# Patient Record
Sex: Female | Born: 1950 | Race: White | Hispanic: No | Marital: Married | Smoking: Never smoker
Health system: Southern US, Community
[De-identification: ages and names within clinical notes are randomized; demographics above are authoritative.]

## PROBLEM LIST (undated history)

## (undated) DIAGNOSIS — M8580 Other specified disorders of bone density and structure, unspecified site: Secondary | ICD-10-CM

## (undated) DIAGNOSIS — J449 Chronic obstructive pulmonary disease, unspecified: Secondary | ICD-10-CM

## (undated) DIAGNOSIS — E669 Obesity, unspecified: Secondary | ICD-10-CM

## (undated) DIAGNOSIS — R0600 Dyspnea, unspecified: Secondary | ICD-10-CM

## (undated) DIAGNOSIS — R0609 Other forms of dyspnea: Secondary | ICD-10-CM

## (undated) DIAGNOSIS — E785 Hyperlipidemia, unspecified: Secondary | ICD-10-CM

## (undated) DIAGNOSIS — R32 Unspecified urinary incontinence: Secondary | ICD-10-CM

## (undated) DIAGNOSIS — R7303 Prediabetes: Secondary | ICD-10-CM

## (undated) DIAGNOSIS — E039 Hypothyroidism, unspecified: Secondary | ICD-10-CM

## (undated) HISTORY — DX: Hypothyroidism, unspecified: E03.9

## (undated) HISTORY — DX: Other specified disorders of bone density and structure, unspecified site: M85.80

## (undated) HISTORY — DX: Chronic obstructive pulmonary disease, unspecified: J44.9

## (undated) HISTORY — PX: RHINOPLASTY: SUR1284

## (undated) HISTORY — DX: Prediabetes: R73.03

## (undated) HISTORY — DX: Hyperlipidemia, unspecified: E78.5

## (undated) HISTORY — DX: Other forms of dyspnea: R06.09

## (undated) HISTORY — DX: Unspecified urinary incontinence: R32

## (undated) HISTORY — DX: Dyspnea, unspecified: R06.00

## (undated) HISTORY — DX: Obesity, unspecified: E66.9

## (undated) HISTORY — PX: APPENDECTOMY: SHX54

---

## 1997-11-06 ENCOUNTER — Other Ambulatory Visit: Admission: RE | Admit: 1997-11-06 | Discharge: 1997-11-06 | Payer: Self-pay | Admitting: *Deleted

## 1998-11-29 ENCOUNTER — Other Ambulatory Visit: Admission: RE | Admit: 1998-11-29 | Discharge: 1998-11-29 | Payer: Self-pay | Admitting: *Deleted

## 1999-12-09 ENCOUNTER — Other Ambulatory Visit: Admission: RE | Admit: 1999-12-09 | Discharge: 1999-12-09 | Payer: Self-pay | Admitting: *Deleted

## 2001-07-02 ENCOUNTER — Other Ambulatory Visit: Admission: RE | Admit: 2001-07-02 | Discharge: 2001-07-02 | Payer: Self-pay | Admitting: *Deleted

## 2003-04-13 ENCOUNTER — Ambulatory Visit: Admission: RE | Admit: 2003-04-13 | Discharge: 2003-04-13 | Payer: Self-pay | Admitting: *Deleted

## 2003-04-13 ENCOUNTER — Encounter (INDEPENDENT_AMBULATORY_CARE_PROVIDER_SITE_OTHER): Payer: Self-pay | Admitting: *Deleted

## 2007-01-04 ENCOUNTER — Encounter: Admission: RE | Admit: 2007-01-04 | Discharge: 2007-01-04 | Payer: Self-pay | Admitting: Family Medicine

## 2007-09-24 ENCOUNTER — Other Ambulatory Visit: Admission: RE | Admit: 2007-09-24 | Discharge: 2007-09-24 | Payer: Self-pay | Admitting: Obstetrics and Gynecology

## 2010-06-03 ENCOUNTER — Other Ambulatory Visit (HOSPITAL_COMMUNITY)
Admission: RE | Admit: 2010-06-03 | Discharge: 2010-06-03 | Disposition: A | Discharge status: Home / Self Care | Payer: BC Managed Care – PPO | Source: Ambulatory Visit | Attending: Family Medicine | Admitting: Family Medicine

## 2010-06-03 DIAGNOSIS — Z124 Encounter for screening for malignant neoplasm of cervix: Secondary | ICD-10-CM | POA: Insufficient documentation

## 2010-12-16 ENCOUNTER — Ambulatory Visit (INDEPENDENT_AMBULATORY_CARE_PROVIDER_SITE_OTHER): Payer: BC Managed Care – PPO | Admitting: Internal Medicine

## 2010-12-16 DIAGNOSIS — R0989 Other specified symptoms and signs involving the circulatory and respiratory systems: Secondary | ICD-10-CM

## 2010-12-16 LAB — PULMONARY FUNCTION TEST

## 2010-12-16 NOTE — Progress Notes (Signed)
PFT done today. 

## 2010-12-27 ENCOUNTER — Encounter: Payer: Self-pay | Admitting: Internal Medicine

## 2011-01-16 ENCOUNTER — Encounter: Payer: Self-pay | Admitting: Internal Medicine

## 2011-01-16 ENCOUNTER — Institutional Professional Consult (permissible substitution): Payer: BC Managed Care – PPO | Admitting: Pulmonary Disease

## 2011-01-17 ENCOUNTER — Ambulatory Visit (INDEPENDENT_AMBULATORY_CARE_PROVIDER_SITE_OTHER): Payer: BC Managed Care – PPO | Admitting: Internal Medicine

## 2011-01-17 ENCOUNTER — Encounter: Payer: Self-pay | Admitting: Internal Medicine

## 2011-01-17 ENCOUNTER — Institutional Professional Consult (permissible substitution): Payer: BC Managed Care – PPO | Admitting: Internal Medicine

## 2011-01-17 DIAGNOSIS — J45909 Unspecified asthma, uncomplicated: Secondary | ICD-10-CM

## 2011-01-17 MED ORDER — MOMETASONE FURO-FORMOTEROL FUM 100-5 MCG/ACT IN AERO: INHALATION_SPRAY | RESPIRATORY_TRACT | Status: DC

## 2011-01-17 NOTE — Patient Instructions (Signed)
Trial of dulera 100  1 puff every 12 hours and if not effective 2 puffs every 12 hours to see if cough and breathing get better  Work on inhaler technique:  relax and gently blow all the way out then take a nice smooth deep breath back in, triggering the inhaler at same time you start breathing in.  Hold for up to 5 seconds if you can.  Rinse and gargle with water when done   If your mouth or throat starts to bother you,   I suggest you time the inhaler to your dental care and after using the inhaler(s) brush teeth and tongue with a baking soda containing toothpaste and when you rinse this out, gargle with it first to see if this helps your mouth and throat.     GERD (REFLUX)  is an extremely common cause of respiratory symptoms, many times with no significant heartburn at all.    It can be treated with medication, but also with lifestyle changes including avoidance of late meals, excessive alcohol, smoking cessation, and avoid fatty foods, chocolate, peppermint, colas, red wine, and acidic juices such as orange juice.  NO MINT OR MENTHOL PRODUCTS SO NO COUGH DROPS  USE SUGARLESS CANDY INSTEAD (jolley ranchers or Stover's)  NO OIL BASED VITAMINS - use powdered substitutes.   Try prilosec 20mg   Take 30-60 min before first meal of the day and Pepcid 20 mg one bedtime until cough is completely gone for at least a week without the need for cough suppression  I think of reflux for chronic cough like I do oxygen for fire (doesn't cause the fire but once you get the oxygen suppressed it usually goes away regardless of the exact cause).    Please schedule a follow up office visit in 2 weeks, sooner if needed

## 2011-01-17 NOTE — Progress Notes (Signed)
  Subjective:    Patient ID: Adriana Oliver, female    DOB: 07-08-1950, 60 y.o.   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  Can walk 1.5 miles s stopping. Cough presently mostly dry. Denies using saba but when given this for her pft made her real shaky.  Sleeping ok now  without nocturnal  or early am exacerbation  of respiratory  c/o's or need for noct saba. Also denies any obvious fluctuation of symptoms with weather or environmental changes or other aggravating or alleviating factors except as outlined above.    Review of Systems  Constitutional: Positive for appetite change. Negative for fever, chills and unexpected weight change.  HENT: Positive for ear pain and congestion. Negative for nosebleeds, sore throat, rhinorrhea, sneezing, trouble swallowing, dental problem, voice change, postnasal drip and sinus pressure.   Eyes: Negative for visual disturbance.  Respiratory: Positive for cough and shortness of breath. Negative for choking.   Cardiovascular: Negative for chest pain and leg swelling.  Gastrointestinal: Negative for vomiting, abdominal pain and diarrhea.  Genitourinary: Negative for difficulty urinating.  Musculoskeletal: Positive for arthralgias.  Skin: Negative for rash.  Neurological: Negative for tremors, syncope and headaches.  Hematological: Does not bruise/bleed easily.       Objective:   Physical Exam  Hoarse amb wf with prominent pseudowheeze   wt 223 01/13/11  HEENT: nl dentition, turbinates, and orophanx. Nl external ear canals without cough reflex   NECK :  without JVD/Nodes/TM/ nl carotid upstrokes bilaterally   LUNGS: no acc  muscle use, clear to A and P bilaterally without cough on insp or exp maneuvers   CV:  RRR  no s3 or murmur or increase in P2, no edema   ABD:  soft and nontender with nl excursion in the supine position. No bruits or organomegaly, bowel sounds nl  MS:  warm without deformities, calf tenderness, cyanosis or clubbing  SKIN: warm and dry without lesions    NEURO:  alert, approp, no deficits        Assessment & Plan:

## 2011-01-18 ENCOUNTER — Encounter: Payer: Self-pay | Admitting: Internal Medicine

## 2011-01-18 DIAGNOSIS — J45909 Unspecified asthma, uncomplicated: Secondary | ICD-10-CM | POA: Insufficient documentation

## 2011-01-18 NOTE — Assessment & Plan Note (Addendum)
Symptoms are markedly disproportionate to objective findings and not clear this is a lung problem but pt does appear to have difficult airway management issues.   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   These are the same pts who not infrequently have failed to tolerate ace inhibitors,  dry powder inhalers or biphosphonates or report having reflux symptoms that don't respond to standard doses of PPI , and are easily confused as having aecopd or asthma flares,   For now will max rx for GERD and start on very low doses of dulera  The proper method of use, as well as anticipated side effects, of this metered-dose inhaler are discussed and demonstrated to the patient. Improved to 75% with extensive coaching.

## 2011-02-01 ENCOUNTER — Ambulatory Visit (INDEPENDENT_AMBULATORY_CARE_PROVIDER_SITE_OTHER): Payer: BC Managed Care – PPO | Admitting: Internal Medicine

## 2011-02-01 ENCOUNTER — Encounter: Payer: Self-pay | Admitting: Internal Medicine

## 2011-02-01 DIAGNOSIS — J45909 Unspecified asthma, uncomplicated: Secondary | ICD-10-CM

## 2011-02-01 MED ORDER — MOMETASONE FURO-FORMOTEROL FUM 100-5 MCG/ACT IN AERO: INHALATION_SPRAY | RESPIRATORY_TRACT | Status: DC

## 2011-02-01 MED ORDER — OMEPRAZOLE 20 MG PO CPDR: DELAYED_RELEASE_CAPSULE | ORAL | Status: DC

## 2011-02-01 NOTE — Patient Instructions (Signed)
No change in treatment through the month of November then try off the second dose of prilosec  Please schedule a follow up office visit in 6 weeks, call sooner if needed

## 2011-02-01 NOTE — Progress Notes (Signed)
  Subjective:    Patient ID: Adriana Oliver, female    DOB: 04/24/1950, 60 y.o.   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  Can walk 1.5 miles s stopping. Cough presently mostly dry. Denies using saba but when given this for her pft made her real shaky. rec Trial of dulera 100  1 puff every 12 hours and if not effective 2 puffs every 12 hours to see if cough and breathing get better Work on inhaler technique GERD diet  Try prilosec 20mg   Take 30-60 min before first meal of the day and Pepcid 20 mg one bedtime    02/01/2011 f/u ov/Torsten Weniger cc some better the first day of rx, gradual complete recovery by 4 days prior to ov.    Sleeping ok now  without nocturnal  or early am exacerbation  of respiratory  c/o's or need for noct saba. Also denies any obvious fluctuation of symptoms with weather or environmental changes or other aggravating or alleviating factors except as outlined above.     ROS  At present neg for  any significant sore throat, dysphagia, itching, sneezing,  nasal congestion or excess/ purulent secretions,  fever, chills, sweats, unintended wt loss, pleuritic or exertional cp, hempoptysis, orthopnea pnd or leg swelling.  Also denies presyncope, palpitations, heartburn, abdominal pain, nausea, vomiting, diarrhea  or change in bowel or urinary habits, dysuria,hematuria,  rash, arthralgias, visual complaints, headache, numbness weakness or ataxia.          Objective:   Physical Exam  Hoarse amb wf with no longer any pseudowheeze   wt 223 01/13/11 > 02/01/2011  223  HEENT: nl dentition, turbinates, and orophanx. Nl external ear canals  without cough reflex   NECK :  without JVD/Nodes/TM/ nl carotid upstrokes bilaterally   LUNGS: no acc muscle use, clear to A and P bilaterally without cough on insp or exp maneuvers   CV:  RRR  no s3 or murmur or increase in P2, no edema   ABD:  soft and nontender with nl excursion in the supine position. No bruits or organomegaly, bowel sounds nl  MS:  warm without deformities, calf tenderness, cyanosis or clubbing           Assessment & Plan:

## 2011-02-02 NOTE — Assessment & Plan Note (Signed)
Not clear whether this was true asthma vs  Classic Upper airway cough syndrome, so named because it's frequently impossible to sort out how much is  CR/sinusitis with freq throat clearing (which can be related to primary GERD)   vs  causing  secondary (" extra esophageal")  GERD from wide swings in gastric pressure that occur with throat clearing, often  promoting self use of mint and menthol lozenges that reduce the lower esophageal sphincter tone and exacerbate the problem further in a cyclical fashion.   These are the same pts who not infrequently have failed to tolerate ace inhibitors,  dry powder inhalers or biphosphonates or report having reflux symptoms that don't respond to standard doses of PPI , and are easily confused as having aecopd or asthma flares,   For now treat both asthma and gerd and then try taper gerd rx p 6 weeks to see if any symptoms flare  See instructions for specific recommendations which were reviewed directly with the patient who was given a copy with highlighter outlining the key components.

## 2011-04-05 ENCOUNTER — Telehealth: Payer: Self-pay | Admitting: Internal Medicine

## 2011-04-05 NOTE — Telephone Encounter (Signed)
I spoke with pt and she c/o cough w/ clear phlem, chest congestion, nasal congestion, chest tightness, foul taste in mouth, PND, sinus pressure is better x couple days. Pt denies any fever, nausea, vomiting, chills, sweats, sore throat. Pt has been taking mucinex OTC. 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 Pt would like something called in but nothing that makes her drowsy. Since MW is not in will forward to Dr. Shelle Iron for recs. Please advise thanks  No Known Allergies

## 2011-04-05 NOTE — Telephone Encounter (Signed)
LM for pt to call back. I made them aware we are now closed and will re-open at 8 am. He voiced his understanding and will relay message

## 2011-04-05 NOTE — Telephone Encounter (Signed)
Wrong doc.  Antionette Fairy

## 2011-04-05 NOTE — Telephone Encounter (Signed)
Dr. Sherene Sires set up a plan for her to follow to help sort thru her diagnosis.  She was supposed to return the second week of DEC.  She really needs to be seen.  I would suggest tylenol cold and sinus for symptom relief until then.  See if we can get her worked in next 24hrs?

## 2011-04-07 NOTE — Telephone Encounter (Signed)
[email or web address].Akhila Mahnken, CMA  

## 2011-04-10 NOTE — Telephone Encounter (Signed)
[email or web address]  

## 2011-04-11 NOTE — Telephone Encounter (Signed)
LMOM and will close encounter per protocol

## 2011-05-16 ENCOUNTER — Telehealth: Payer: Self-pay | Admitting: Internal Medicine

## 2011-05-16 MED ORDER — PREDNISONE 10 MG PO TABS: ORAL_TABLET | ORAL | Status: DC

## 2011-05-16 MED ORDER — AZITHROMYCIN 250 MG PO TABS: ORAL_TABLET | ORAL | Status: AC

## 2011-05-16 NOTE — Telephone Encounter (Signed)
Pt called back. Wants this asap as she has to be back in class at 2:30 today.

## 2011-05-16 NOTE — Telephone Encounter (Signed)
I spoke with pt and she c/o wet cough (can;t bring anything up), PND, nasal congestion, runny nose, and sinus pressure x Saturday. Denies any fever, chills, sweats, nausea, vomiting, fever. Pt states she is taking mucinex cough suppressant. 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 Pt is requesting to have something called in. Please advise Dr. Sherene Sires, thanks  No Known Allergies

## 2011-05-16 NOTE — Telephone Encounter (Signed)
LMOM for pt TCB 

## 2011-05-16 NOTE — Telephone Encounter (Signed)
Notified pt Rx called in. 

## 2011-05-16 NOTE — Telephone Encounter (Signed)
Sorry, didn't see the message until p 1230 - rx zpak and Prednisone 10 mg take  4 each am x 2 days,   2 each am x 2 days,  1 each am x2days and stop then ov if not 100% better

## 2011-06-19 ENCOUNTER — Telehealth: Payer: Self-pay | Admitting: Internal Medicine

## 2011-06-19 NOTE — Telephone Encounter (Signed)
Needs ov with all meds in hand to regroup

## 2011-06-19 NOTE — Telephone Encounter (Signed)
This is actually a MW patient-will forward to him to advise.

## 2011-06-19 NOTE — Telephone Encounter (Signed)
Called patient at home (work office was closed), advised of MW's recs.  Pt okay with these recommendations and appt scheduled with MW 3.20.13 @ 0915.  Pt is aware to bring all meds with her: prescriptions, inhalers, OTC.

## 2011-06-19 NOTE — Telephone Encounter (Signed)
CY-please advise as patient says she can get RX through insurance for an allergy medication($10 copay) to help with her cough due to pollen right now instead of having to pay $35 OTC. Thanks.

## 2011-06-21 ENCOUNTER — Encounter: Payer: Self-pay | Admitting: Internal Medicine

## 2011-06-21 ENCOUNTER — Ambulatory Visit (INDEPENDENT_AMBULATORY_CARE_PROVIDER_SITE_OTHER): Payer: BC Managed Care – PPO | Admitting: Internal Medicine

## 2011-06-21 DIAGNOSIS — J45909 Unspecified asthma, uncomplicated: Secondary | ICD-10-CM

## 2011-06-21 MED ORDER — PREDNISONE (PAK) 10 MG PO TABS: ORAL_TABLET | ORAL | Status: AC

## 2011-06-21 NOTE — Progress Notes (Signed)
  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  Can walk 1.5 miles s stopping. Cough presently mostly dry. Denies using saba but when given this for her pft made her real shaky. rec Trial of dulera 100  1 puff every 12 hours and if not effective 2 puffs every 12 hours to see if cough and breathing get better Work on inhaler technique GERD diet  Try prilosec 20mg   Take 30-60 min before first meal of the day and Pepcid 20 mg one bedtime    02/01/2011 f/u ov/Olumide Dolinger cc some better the first day of rx, gradual complete recovery by 4 days prior to ov.  rec No change in treatment through the month of November then try off the second dose of prilosec Please schedule a follow up office visit in 6 weeks> did not happen   06/21/2011 f/u ov/Michaelpaul Apo doing great after last visit tapered prilosec completely off  And stopped dulera and did fine until March 2013 when exposed pollen at [facility] and abuptly started coughing severe mostly dry and restarted dulera 100 2bid and omeprazole 20 min ac qd  Assoc with watery rhinitis- sob mostly with coughing.     Sleeping ok now  without nocturnal  or early am exacerbation  of respiratory  c/o's or need for noct saba. Also denies any obvious fluctuation of symptoms with weather or environmental changes or other aggravating or alleviating factors except as outlined above.     ROS  At present neg for  any significant sore throat, dysphagia, itching, sneezing,  nasal congestion or excess/ purulent  secretions,  fever, chills, sweats, unintended wt loss, pleuritic or exertional cp, hempoptysis, orthopnea pnd or leg swelling.  Also denies presyncope, palpitations, heartburn, abdominal pain, nausea, vomiting, diarrhea  or change in bowel or urinary habits, dysuria,hematuria,  rash, arthralgias, visual complaints, headache, numbness weakness or ataxia.          Objective:   Physical Exam  amb slt hoarse wf nad   wt 223 01/13/11 > 02/01/2011  223 > 06/21/2011  216  HEENT: nl dentition, turbinates, and orophanx. Nl external ear canals without cough reflex   NECK :  without JVD/Nodes/TM/ nl carotid upstrokes bilaterally   LUNGS: no acc muscle use, clear to A and P bilaterally without cough on insp or exp maneuvers   CV:  RRR  no s3 or murmur or increase in P2, no edema   ABD:  soft and nontender with nl excursion in the supine position. No bruits or organomegaly, bowel sounds nl  MS:  warm without deformities, calf tenderness, cyanosis or clubbing           Assessment & Plan:

## 2011-06-21 NOTE — Patient Instructions (Signed)
Prednisone 10 mg take  4 each am x 2 days,   2 each am x 2 days,  1 each am x2days and stop   Trial of dulera 100  2 puff every 12 hours and if not effective 2 puffs every 12 hours and out through nose  Work on perfecting  inhaler technique:  relax and gently blow all the way out then take a nice smooth deep breath back in, triggering the inhaler at same time you start breathing in.  Hold for up to 5 seconds if you can.  Rinse and gargle with water when done   If your mouth or throat starts to bother you,   I suggest you time the inhaler to your dental care and after using the inhaler(s) brush teeth and tongue with a baking soda containing toothpaste and when you rinse this out, gargle with it first to see if this helps your mouth and throat.     GERD (REFLUX)  is an extremely common cause of respiratory symptoms, many times with no significant heartburn at all.    It can be treated with medication, but also with lifestyle changes including avoidance of late meals, excessive alcohol, smoking cessation, and avoid fatty foods, chocolate, peppermint, colas, red wine, and acidic juices such as orange juice.  NO MINT OR MENTHOL PRODUCTS SO NO COUGH DROPS  USE SUGARLESS CANDY INSTEAD (jolley ranchers or Stover's)  NO OIL BASED VITAMINS - use powdered substitutes.   Try prilosec 20mg  Take 30- 60 min before your first and last meals of the day  and Pepcid 20 mg one bedtime until cough is completely gone for at least a week without the need for cough suppression then reduce one prilosec 20 mg Take 30-60 min before first meal of the day    Please schedule a follow up office visit in 4 weeks, sooner if needed with PFT's

## 2011-06-23 NOTE — Assessment & Plan Note (Addendum)
-   PFT's 12/16/10 FEV1  2.15 (69%) ratio 57 but 17% better p B2, DLCO 89%   - hfa 75% 01/16/11> 90% 06/21/2011   Based on previous pft's showing mod persistent asthma should not have stopped maintenance rx even though felt better.  The proper method of use, as well as anticipated side effects, of a metered-dose inhaler are discussed and demonstrated to the patient. Improved effectiveness after extensive coaching during this visit to a level of approximately  90%  ? Acid reflux ? Allergy playing a role in her present flare> rec treat both and return for pft's when at baseline.  See instructions for specific recommendations which were reviewed directly with the patient who was given a copy with highlighter outlining the key components.

## 2011-07-03 ENCOUNTER — Encounter: Payer: Self-pay | Admitting: Internal Medicine

## 2011-07-10 ENCOUNTER — Telehealth: Payer: Self-pay | Admitting: Internal Medicine

## 2011-07-10 NOTE — Telephone Encounter (Signed)
Per MW- Alpha 1 was neg.  LMTCB so I can inform the pt.

## 2011-07-12 NOTE — Telephone Encounter (Signed)
LMTCB

## 2011-07-12 NOTE — Telephone Encounter (Signed)
Pt returned Leslie's call.  Advised of A1AT results as stated by MW.  Pt verbalized her understanding and denied any questions.  Nothing further needed.

## 2011-07-14 ENCOUNTER — Telehealth: Payer: Self-pay | Admitting: Internal Medicine

## 2011-07-14 NOTE — Telephone Encounter (Signed)
Member ID # [id number]. Omeprazole 20 mg for twice daily dosing has been APPROVED from 07/14/11 through 07/13/12. Case # [id number]. Pharmacy notified and they will call the patient to let her know her prescription is ready for pick up.

## 2011-07-18 ENCOUNTER — Encounter: Payer: Self-pay | Admitting: Internal Medicine

## 2011-07-26 ENCOUNTER — Ambulatory Visit: Payer: BC Managed Care – PPO | Admitting: Internal Medicine

## 2011-08-11 ENCOUNTER — Ambulatory Visit (INDEPENDENT_AMBULATORY_CARE_PROVIDER_SITE_OTHER): Payer: BC Managed Care – PPO | Admitting: Internal Medicine

## 2011-08-11 ENCOUNTER — Encounter: Payer: Self-pay | Admitting: Internal Medicine

## 2011-08-11 DIAGNOSIS — J45909 Unspecified asthma, uncomplicated: Secondary | ICD-10-CM

## 2011-08-11 LAB — PULMONARY FUNCTION TEST

## 2011-08-11 MED ORDER — LORATADINE 10 MG PO CAPS: 1.0000 | ORAL_CAPSULE | Freq: Every day | ORAL | Status: DC

## 2011-08-11 NOTE — Progress Notes (Signed)
PFT done today. 

## 2011-08-11 NOTE — Patient Instructions (Addendum)
Dulera 100 Take 2 puffs first thing in am and then another 2 puffs about 12 hours later but ok to adjust down if doing great.  Clariton (loratidine 10 mg) one daily as needed itching sneezing runny nose    If you are satisfied with your treatment plan let your doctor know and he/she can either refill your medications or you can return here when your prescription runs out.     If in any way you are not 100% satisfied,  please tell us.  If 100% better, tell your friends!

## 2011-08-11 NOTE — Progress Notes (Signed)
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  Can walk 1.5 miles s stopping. Cough presently mostly dry. Denies using saba but when given this for her pft made her real shaky. rec Trial of dulera 100  1 puff every 12 hours and if not effective 2 puffs every 12 hours to see if cough and breathing get better Work on inhaler technique GERD diet  Try prilosec 20mg   Take 30-60 min before first meal of the day and Pepcid 20 mg one bedtime    02/01/2011 f/u ov/Adriana Oliver cc some better the first day of rx, gradual complete recovery by 4 days prior to ov.  rec No change in treatment through the month of November then try off the second dose of prilosec Please schedule a follow up office visit in 6 weeks> did not happen   06/21/2011 f/u ov/Adriana Oliver doing great after last visit tapered prilosec completely off  And stopped dulera and did fine until March 2013 when exposed pollen at [facility] and abuptly started coughing severe mostly dry and restarted dulera 100 2bid and omeprazole 20 min ac qd  Assoc with watery rhinitis- sob mostly with coughing. rec Prednisone 10 mg take  4 each am x 2 days,   2 each am x 2 days,  1 each am x2days and stop  Trial of dulera 100  2 puff every 12 hours and if not effective 2 puffs every 12 hours and out through nose Work on perfecting  inhaler technique     GERD diet Try prilosec 20mg  Take 30- 60 min before your first and last meals of the day  and Pepcid 20 mg one bedtime until cough is completely gone for  at least a week without the need for cough suppression then reduce one prilosec 20 mg Take 30-60 min before first meal of the day    08/11/2011 f/u ov/Adriana Oliver rarely taking dulera 100 (maybe a couple times a week) and maint rx with clariton daily "don't want to get hooked on inhalers).  No cough or limiting sob or need for daytime saba.     Sleeping ok now  without nocturnal  or early am exacerbation  of respiratory  c/o's or need for noct saba. Also denies any obvious fluctuation of symptoms with weather or environmental changes or other aggravating or alleviating factors except as outlined above.     ROS  At present neg for  any significant sore throat, dysphagia, itching, sneezing,  nasal congestion or excess/ purulent secretions,  fever, chills, sweats, unintended wt loss, pleuritic or exertional cp, hempoptysis, orthopnea pnd or leg swelling.  Also denies presyncope, palpitations, heartburn, abdominal pain, nausea, vomiting, diarrhea  or change in bowel or urinary habits, dysuria,hematuria,  rash, arthralgias, visual complaints, headache, numbness weakness or ataxia.          Objective:   Physical Exam  amb slt hoarse wf nad   wt 223 01/13/11 > 02/01/2011  223 > 06/21/2011  216 > 08/11/2011  220  HEENT: nl dentition, turbinates, and orophanx. Nl external ear canals without cough reflex   NECK :  without JVD/Nodes/TM/ nl carotid upstrokes bilaterally   LUNGS: no acc muscle use, clear to A and P bilaterally without cough on insp or exp maneuvers   CV:  RRR  no s3 or murmur or increase in P2, no edema   ABD:  soft and nontender with nl excursion in the supine position. No bruits or organomegaly, bowel sounds nl  MS:  warm without deformities, calf tenderness, cyanosis or clubbing           Assessment & Plan:

## 2011-08-12 NOTE — Assessment & Plan Note (Addendum)
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  Clearly minimal airflow obst at present despite very poor compliance with dulera - not clear how much of her symptoms were related to  Classic Upper airway cough syndrome, so named because it's frequently impossible to sort out how much is  CR/sinusitis with freq throat clearing (which can be related to primary GERD)   vs  causing  secondary (" extra esophageal")  GERD from wide swings in gastric pressure that occur with throat clearing, often  promoting self use of mint and menthol lozenges that reduce the lower esophageal sphincter tone and exacerbate the problem further in a cyclical fashion.   These are the same pts (now being labeled as having "irritable larynx syndrome" by some cough centers) who not infrequently have a history of having failed to tolerate ace inhibitors,  dry powder inhalers or biphosphonates or report having atypical reflux symptoms that don't respond to standard doses of PPI , and are easily confused as having aecopd or asthma flares by even experienced allergists/ pulmonologists.    I discussed risk of recurrent asthma flares and remodeling issues but don't think this will be much of a problem if she'll remember to use dulera consistently and not worry about getting "hooked on it" - if she does slack off, the nice thing about dulera is it starts working again w/in 15 min and she can't get the laba s the ics. (symbicort works the same but this is not a good pt for ics or advair, which work too slow)   Each maintenance medication was reviewed in detail including most importantly the difference between maintenance and as needed and under what circumstances the prns are to be used.  Please see instructions for details which were reviewed in writing and the patient given a copy.     The proper method of use, as well as anticipated side effects, of a metered-dose inhaler are discussed and demonstrated to the patient. Improved effectiveness after extensive coaching during this visit to a level of approximately  90%

## 2011-12-05 ENCOUNTER — Telehealth: Payer: Self-pay | Admitting: Internal Medicine

## 2011-12-05 NOTE — Telephone Encounter (Signed)
Lmomtcb x 1 

## 2011-12-06 NOTE — Telephone Encounter (Signed)
Spoke with pt who c/o congestion, nonprod cough, increased SOB at rest and with exertion, and chest tightness x 1 week.  Denies wheezing, f/c/s.  Taking mucinex bid and dulera bid.  No rescue inhaler on pt's med list.  She was last seen by Dr. Sherene Sires on 08/11/11.  I offered OV -- she declined.  States this is from school starting back and being around the kids.  Requesting recs.  Dr. Sherene Sires, pls advise.  Thank you.    No Known Allergies

## 2011-12-06 NOTE — Telephone Encounter (Signed)
Prednisone 10 mg take  4 each am x 2 days,   2 each am x 2 days,  1 each am x2days and stop   Needs ov with all inhalers in hand asap

## 2011-12-06 NOTE — Telephone Encounter (Signed)
LMTCBx1 at home number. Rx has not been sent until ov set.Carron Curie, CMA

## 2011-12-06 NOTE — Telephone Encounter (Signed)
lmomtcb x 2 for pt on work and mobile #

## 2011-12-07 NOTE — Telephone Encounter (Signed)
LMOVM TCB x1 to inform patient will send prednisone but she will need to follow up w/in 30days as directed by MW below.

## 2011-12-07 NOTE — Telephone Encounter (Signed)
I spoke with the pt and she again refuses appt, stating its just not possible to schedule an appt at this time. Dr. Sherene Sires do you still want me to send in the prednisone? Adriana Oliver, CMA

## 2011-12-07 NOTE — Telephone Encounter (Signed)
Ok to call in the prednisone but needs to get back for f/u within 30 days or we'll need to ask her to see somebody else as this is not the way we treat asthma in this clinic - ok to see Tammy NP also

## 2011-12-08 NOTE — Telephone Encounter (Signed)
lmomtcb x1 on home # and mobile # 

## 2011-12-11 NOTE — Telephone Encounter (Signed)
Spoke with pt and advised will call in pred taper, but must schedule appt. She still refused an appt and so I advised that we can not call in anything at this time. Pt states "that's fine have a great day" and hung up the phone.

## 2012-02-23 ENCOUNTER — Other Ambulatory Visit: Payer: Self-pay | Admitting: Family Medicine

## 2012-02-23 ENCOUNTER — Ambulatory Visit
Admission: RE | Admit: 2012-02-23 | Discharge: 2012-02-23 | Disposition: A | Discharge status: Home / Self Care | Payer: BC Managed Care – PPO | Source: Ambulatory Visit | Attending: Family Medicine | Admitting: Family Medicine

## 2012-02-23 DIAGNOSIS — M5410 Radiculopathy, site unspecified: Secondary | ICD-10-CM

## 2012-04-19 ENCOUNTER — Encounter: Payer: Self-pay | Admitting: Adult Health

## 2012-04-19 ENCOUNTER — Ambulatory Visit (INDEPENDENT_AMBULATORY_CARE_PROVIDER_SITE_OTHER): Payer: BC Managed Care – PPO | Admitting: Adult Health

## 2012-04-19 VITALS — BP 120/82 | HR 84 | Temp 98.7°F | Ht 62.0 in | Wt 210.8 lb

## 2012-04-19 DIAGNOSIS — J45909 Unspecified asthma, uncomplicated: Secondary | ICD-10-CM

## 2012-04-19 MED ORDER — AZITHROMYCIN 250 MG PO TABS: ORAL_TABLET | ORAL | Status: AC

## 2012-04-19 MED ORDER — HYDROCODONE-HOMATROPINE 5-1.5 MG/5ML PO SYRP: 5.0000 mL | ORAL_SOLUTION | Freq: Four times a day (QID) | ORAL | Status: AC | PRN

## 2012-04-19 MED ORDER — PREDNISONE 10 MG PO TABS: ORAL_TABLET | ORAL | Status: DC

## 2012-04-19 NOTE — Patient Instructions (Addendum)
Z-Pak take as directed. Prednisone taper. Over the next week Mucinex DM twice daily as needed. For cough and congestion. Hydromet 1-2 teaspoons every 4-6 hours as needed. For cough, may make you sleepy Fluids and rest. Tylenol as needed. Take Dulera 2 puffs twice daily on a regular basis. Follow up in 3 months with Dr. Sherene Sires and as needed Please contact office for sooner follow up if symptoms do not improve or worsen or seek emergency care

## 2012-04-19 NOTE — Progress Notes (Signed)
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  Can walk 1.5 miles s stopping. Cough presently mostly dry. Denies using saba but when given this for her pft made her real shaky. rec Trial of dulera 100  1 puff every 12 hours and if not effective 2 puffs every 12 hours to see if cough and breathing get better Work on inhaler technique GERD diet  Try prilosec 20mg   Take 30-60 min before first meal of the day and Pepcid 20 mg one bedtime    02/01/2011 f/u ov/Wert cc some better the first day of rx, gradual complete recovery by 4 days prior to ov.  rec No change in treatment through the month of November then try off the second dose of prilosec Please schedule a follow up office visit in 6 weeks> did not happen   06/21/2011 f/u ov/Wert doing great after last visit tapered prilosec completely off  And stopped dulera and did fine until March 2013 when exposed pollen at [facility] and abuptly started coughing severe mostly dry and restarted dulera 100 2bid and omeprazole 20 min ac qd  Assoc with watery rhinitis- sob mostly with coughing. rec Prednisone 10 mg take  4 each am x 2 days,   2 each am x 2 days,  1 each am x2days and stop  Trial of dulera 100  2 puff every 12 hours and if not effective 2 puffs every 12 hours and out through nose Work on perfecting  inhaler technique     GERD diet Try prilosec 20mg  Take 30- 60 min before your first and last meals of the day  and Pepcid 20 mg one bedtime until cough is completely gone for  at least a week without the need for cough suppression then reduce one prilosec 20 mg Take 30-60 min before first meal of the day    08/11/2011 f/u ov/Wert rarely taking dulera 100 (maybe a couple times a week) and maint rx with clariton daily "don't want to get hooked on inhalers).  No cough or limiting sob or need for daytime saba.     04/19/12 Acute OV  Complains of pt states productive cough,soc,wheezing,fever/chills,chest congestion.over a week . Has not been taking Dulera on reg basis .  Has been exposed to several students with URI symptoms.  OTC not helping.  No body aches .      ROS   Constitutional:   No  weight loss, night sweats,  + Fevers, chills, fatigue, or  lassitude.  HEENT:   No headaches,  Difficulty swallowing,  Tooth/dental problems, or  Sore throat,                No sneezing, itching, ear ache, nasal congestion, post nasal drip,   CV:  No chest pain,  Orthopnea, PND, swelling in lower extremities, anasarca, dizziness, palpitations, syncope.   GI  No heartburn, indigestion, abdominal pain, nausea, vomiting, diarrhea, change in bowel habits, loss of appetite, bloody stools.   Resp: ,  No  coughing up of blood.   Marland Kitchen  No chest wall deformity  Skin: no rash or lesions.  GU: no dysuria, change in color of urine, no urgency or frequency.  No flank pain, no hematuria   MS:  No joint pain or swelling.  No decreased range of motion.  No back pain.  Psych:  No change in mood or affect. No depression or anxiety.  No memory loss.              Objective:   Physical Exam  amb slt hoarse wf nad   wt 223 01/13/11 > 02/01/2011  223 > 06/21/2011  216 > 08/11/2011  220> 210 1/17   HEENT: nl dentition, turbinates, and orophanx. Nl external ear canals without cough reflex   NECK :  without JVD/Nodes/TM/ nl carotid upstrokes bilaterally   LUNGS: few exp wheezes    CV:  RRR  no s3 or murmur or increase in P2, no edema   ABD:  soft and nontender with nl  excursion in the supine position. No bruits or organomegaly, bowel sounds nl  MS:  warm without deformities, calf tenderness, cyanosis or clubbing           Assessment & Plan:

## 2012-04-21 NOTE — Assessment & Plan Note (Signed)
Flare with URI   Plan Z-Pak take as directed. Prednisone taper. Over the next week Mucinex DM twice daily as needed. For cough and congestion. Hydromet 1-2 teaspoons every 4-6 hours as needed. For cough, may make you sleepy Fluids and rest. Tylenol as needed. Take Dulera 2 puffs twice daily on a regular basis. Follow up in 3 months with Dr. Sherene Sires and as needed Please contact office for sooner follow up if symptoms do not improve or worsen or seek emergency care

## 2012-04-29 ENCOUNTER — Other Ambulatory Visit: Payer: Self-pay | Admitting: Internal Medicine

## 2012-05-20 ENCOUNTER — Telehealth: Payer: Self-pay | Admitting: Internal Medicine

## 2012-05-20 NOTE — Telephone Encounter (Signed)
LMTCB

## 2012-05-21 NOTE — Telephone Encounter (Signed)
lmomtcb at both #s provided above.

## 2012-05-22 NOTE — Telephone Encounter (Signed)
lmtcb x3 advising we have been trying to get in touch with her x 3 days now w/o a call back. If anything further was needed to give [place] a call back. Will sign off per triage protocol.

## 2012-09-08 ENCOUNTER — Other Ambulatory Visit: Payer: Self-pay | Admitting: Internal Medicine

## 2013-11-18 ENCOUNTER — Other Ambulatory Visit (HOSPITAL_COMMUNITY)
Admission: RE | Admit: 2013-11-18 | Discharge: 2013-11-18 | Disposition: A | Discharge status: Home / Self Care | Payer: BC Managed Care – PPO | Source: Ambulatory Visit | Attending: Family Medicine | Admitting: Family Medicine

## 2013-11-18 ENCOUNTER — Other Ambulatory Visit: Payer: Self-pay | Admitting: Family Medicine

## 2013-11-18 DIAGNOSIS — Z1151 Encounter for screening for human papillomavirus (HPV): Secondary | ICD-10-CM | POA: Diagnosis present

## 2013-11-18 DIAGNOSIS — Z124 Encounter for screening for malignant neoplasm of cervix: Secondary | ICD-10-CM | POA: Insufficient documentation

## 2013-11-20 LAB — CYTOLOGY - PAP

## 2014-05-14 ENCOUNTER — Emergency Department (HOSPITAL_COMMUNITY): Payer: BC Managed Care – PPO

## 2014-05-14 ENCOUNTER — Encounter (HOSPITAL_COMMUNITY): Payer: Self-pay | Admitting: Adult Health

## 2014-05-14 ENCOUNTER — Emergency Department (HOSPITAL_COMMUNITY)
Admission: EM | Admit: 2014-05-14 | Discharge: 2014-05-15 | Disposition: A | Discharge status: Home / Self Care | Payer: BC Managed Care – PPO | Attending: Emergency Medicine | Admitting: Emergency Medicine

## 2014-05-14 DIAGNOSIS — Y998 Other external cause status: Secondary | ICD-10-CM | POA: Diagnosis not present

## 2014-05-14 DIAGNOSIS — Z7952 Long term (current) use of systemic steroids: Secondary | ICD-10-CM | POA: Insufficient documentation

## 2014-05-14 DIAGNOSIS — S43014A Anterior dislocation of right humerus, initial encounter: Secondary | ICD-10-CM | POA: Diagnosis not present

## 2014-05-14 DIAGNOSIS — E785 Hyperlipidemia, unspecified: Secondary | ICD-10-CM | POA: Insufficient documentation

## 2014-05-14 DIAGNOSIS — Y92009 Unspecified place in unspecified non-institutional (private) residence as the place of occurrence of the external cause: Secondary | ICD-10-CM

## 2014-05-14 DIAGNOSIS — E669 Obesity, unspecified: Secondary | ICD-10-CM | POA: Insufficient documentation

## 2014-05-14 DIAGNOSIS — Y9389 Activity, other specified: Secondary | ICD-10-CM | POA: Diagnosis not present

## 2014-05-14 DIAGNOSIS — S4991XA Unspecified injury of right shoulder and upper arm, initial encounter: Secondary | ICD-10-CM | POA: Diagnosis present

## 2014-05-14 DIAGNOSIS — Z791 Long term (current) use of non-steroidal anti-inflammatories (NSAID): Secondary | ICD-10-CM | POA: Insufficient documentation

## 2014-05-14 DIAGNOSIS — E039 Hypothyroidism, unspecified: Secondary | ICD-10-CM | POA: Insufficient documentation

## 2014-05-14 DIAGNOSIS — Y9289 Other specified places as the place of occurrence of the external cause: Secondary | ICD-10-CM | POA: Insufficient documentation

## 2014-05-14 DIAGNOSIS — R202 Paresthesia of skin: Secondary | ICD-10-CM | POA: Diagnosis not present

## 2014-05-14 DIAGNOSIS — J449 Chronic obstructive pulmonary disease, unspecified: Secondary | ICD-10-CM | POA: Insufficient documentation

## 2014-05-14 DIAGNOSIS — M79601 Pain in right arm: Secondary | ICD-10-CM

## 2014-05-14 DIAGNOSIS — W010XXA Fall on same level from slipping, tripping and stumbling without subsequent striking against object, initial encounter: Secondary | ICD-10-CM | POA: Diagnosis not present

## 2014-05-14 DIAGNOSIS — Z79899 Other long term (current) drug therapy: Secondary | ICD-10-CM | POA: Insufficient documentation

## 2014-05-14 DIAGNOSIS — W19XXXA Unspecified fall, initial encounter: Secondary | ICD-10-CM

## 2014-05-14 DIAGNOSIS — S43004A Unspecified dislocation of right shoulder joint, initial encounter: Secondary | ICD-10-CM

## 2014-05-14 MED ORDER — ONDANSETRON HCL 4 MG/2ML IJ SOLN
4.0000 mg | Freq: Once | INTRAMUSCULAR | Status: AC
  Administered 2014-05-14: 4 mg via INTRAVENOUS
  Filled 2014-05-14: qty 2

## 2014-05-14 MED ORDER — HYDROCODONE-ACETAMINOPHEN 5-325 MG PO TABS: 1.0000 | ORAL_TABLET | Freq: Four times a day (QID) | ORAL | Status: DC | PRN

## 2014-05-14 MED ORDER — PROPOFOL 10 MG/ML IV BOLUS
INTRAVENOUS | Status: AC | PRN
  Administered 2014-05-14 (×2): 50 mg via INTRAVENOUS

## 2014-05-14 MED ORDER — HYDROMORPHONE HCL 1 MG/ML IJ SOLN
1.0000 mg | Freq: Once | INTRAMUSCULAR | Status: AC
  Administered 2014-05-14: 1 mg via INTRAVENOUS
  Filled 2014-05-14: qty 1

## 2014-05-14 MED ORDER — MORPHINE SULFATE 4 MG/ML IJ SOLN
8.0000 mg | Freq: Once | INTRAMUSCULAR | Status: AC
  Administered 2014-05-14: 8 mg via INTRAVENOUS
  Filled 2014-05-14: qty 2

## 2014-05-14 MED ORDER — NAPROXEN 500 MG PO TABS: 500.0000 mg | ORAL_TABLET | Freq: Two times a day (BID) | ORAL | Status: DC | PRN

## 2014-05-14 MED ORDER — PROPOFOL 10 MG/ML IV BOLUS
50.0000 mg | INTRAVENOUS | Status: DC | PRN
  Filled 2014-05-14: qty 20

## 2014-05-14 NOTE — Progress Notes (Signed)
Orthopedic Tech Progress Note Patient Details:  Adriana Oliver 08-10-1950 [id number]  Ortho Devices Type of Ortho Device: Sling immobilizer Ortho Device/Splint Location: RUE Ortho Device/Splint Interventions: Ordered, Application   Jennye MoccasinHughes, [name] 05/14/2014, 10:36 PM

## 2014-05-14 NOTE — ED Notes (Signed)
Presents post fall, while holding grandson, she leaned over a baby gate to put child down and lost balance, falling and landing on right shoulder. Right shoulder lower than left-c/o severe pain-CMS intact. Denies other injuries.

## 2014-05-14 NOTE — ED Provider Notes (Signed)
CSN: [id number]     Arrival date & time 05/14/14  1931 History   First MD Initiated Contact with Patient 05/14/14 2008     Chief Complaint  Patient presents with  . Shoulder Injury     (Consider location/radiation/quality/duration/timing/severity/associated sxs/prior Treatment) HPI Comments: Adriana Oliver is a 64 y.o. female with a PMHx of COPD, HLD, obesity, and hypothyroidism, who presents to the ED with complaints of mechanical fall approximately 1 hour prior to arrival around 7:15 PM. She states she was holding her grandson, tripped over the baby gate when she was trying to put him down, landing on her right shoulder. She states that she currently has 10/10 right shoulder pain that radiates down towards her elbow, which is throbbing and constant, worse with movement and with no medications tried prior to arrival. She states she has never injured this shoulder before. She reports tingling of the outside of her right shoulder, but denies any numbness or weakness in her forearm or hands. She denies any other injuries sustained during the fall, denying any head injury or loss of consciousness, neck pain or back pain, elbow pain, wrist pain, chest pain or shortness of breath, headache, or vision changes. She denies having any symptoms prior to falling that led to her fall, adamantly reports that she simply tripped over the baby gate.   No hx of HTN, no meds taken for BP. PCP is Dr. Cam Hai.  Patient is a 64 y.o. female presenting with shoulder injury. The history is provided by the patient. No language interpreter was used.  Shoulder Injury This is a new problem. The current episode started today. The problem occurs rarely. The problem has been unchanged. Associated symptoms include arthralgias (R shoulder). Pertinent negatives include no abdominal pain, chest pain, headaches, joint swelling, nausea, neck pain, numbness, vomiting or weakness. Exacerbated by: movement of R shoulder. She has  tried nothing for the symptoms. The treatment provided no relief.    Past Medical History  Diagnosis Date  . Incontinence   . Hypothyroidism   . Obesity   . Hyperlipidemia   . COPD (chronic obstructive pulmonary disease)    Past Surgical History  Procedure Laterality Date  . Rhinoplasty    . Appendectomy     Family History  Problem Relation Age of Onset  . Asthma Mother     deceased  . Diabetes Sister   . Arthritis Sister   . Emphysema Mother    History  Substance Use Topics  . Smoking status: Never Smoker   . Smokeless tobacco: Never Used  . Alcohol Use: No   OB History    No data available     Review of Systems  HENT: Negative for facial swelling.   Eyes: Negative for visual disturbance.  Respiratory: Negative for shortness of breath.   Cardiovascular: Negative for chest pain.  Gastrointestinal: Negative for nausea, vomiting and abdominal pain.  Musculoskeletal: Positive for arthralgias (R shoulder). Negative for back pain, joint swelling and neck pain.  Skin: Negative for color change and wound.  Neurological: Negative for dizziness, weakness, light-headedness, numbness and headaches.       +tingling in RUE  Hematological: Does not bruise/bleed easily.  Psychiatric/Behavioral: Negative for confusion.   10 Systems reviewed and are negative for acute change except as noted in the HPI.    Allergies  Review of patient's allergies indicates no known allergies.  Home Medications   Prior to Admission medications   Medication Sig Start Date End Date Taking?  Authorizing Provider  acetaminophen (TYLENOL) 650 MG CR tablet Take 650 mg by mouth every 8 (eight) hours as needed.    Historical Provider, MD  Alpha-D-Galactosidase Charlyne Quale) TABS As directed when needed    Historical Provider, MD  benzonatate (TESSALON) 100 MG capsule Take 100 mg by mouth 3 (three) times daily as needed.  04/15/12   Historical Provider, MD  Docosanol (ABREVA EX) Apply topically. As directed  as needed    Historical Provider, MD  DULERA 100-5 MCG/ACT AERO inhale 1 to 2 puffs by mouth twice a day 04/29/12   Nyoka Cowden, MD  fexofenadine (ALLEGRA) 180 MG tablet Take 180 mg by mouth daily.    Historical Provider, MD  HYDROcodone-homatropine (HYCODAN) 5-1.5 MG/5ML syrup 5 mLs every 4 (four) hours as needed.  04/15/12   Historical Provider, MD  levothyroxine (SYNTHROID, LEVOTHROID) 88 MCG tablet Take 1 tablet by mouth daily. 12/16/10   Historical Provider, MD  loratadine (CLARITIN) 10 MG tablet take 1 tablet by mouth once daily 09/08/12   Nyoka Cowden, MD  Naproxen Sodium (ALEVE) 220 MG CAPS Per bottle directions as needed    Historical Provider, MD  omeprazole (PRILOSEC) 20 MG capsule before first and last meals daily 02/01/11   Nyoka Cowden, MD  Phenylephrine-DM-GG-APAP (RA MUCUS RELIEF PLUS) 5-10-200-325 MG TABS Take 1 tablet by mouth 2 (two) times daily.    Historical Provider, MD  pravastatin (PRAVACHOL) 20 MG tablet Take 1 tablet by mouth daily. 12/16/10   Historical Provider, MD  predniSONE (DELTASONE) 10 MG tablet 4 tabs for 2 days, then 3 tabs for 2 days, 2 tabs for 2 days, then 1 tab for 2 days, then stop 04/19/12   Tammy S Parrett, NP   BP 156/105 mmHg  Pulse 93  Temp(Src) 97.9 F (36.6 C) (Oral)  Resp 20  Ht  (1.575 m)  Wt 203 lb (92.08 kg)  BMI 37.12 kg/m2  SpO2 92% Physical Exam  Constitutional: She is oriented to person, place, and time. She appears well-developed and well-nourished.  Non-toxic appearance. She appears distressed.  Appears very uncomfortable in pain, hypertensive at 156/105 likely pain related.   HENT:  Head: Normocephalic and atraumatic.  Mouth/Throat: Mucous membranes are normal.  Eyes: Conjunctivae and EOM are normal. Right eye exhibits no discharge. Left eye exhibits no discharge.  Neck: Normal range of motion. Neck supple. No spinous process tenderness and no muscular tenderness present. Normal range of motion present.  FROM intact without  spinous process or paraspinous muscle TTP, no bony stepoffs or deformities, no muscle spasms. No bruising or swelling.   Cardiovascular: Normal rate, regular rhythm, normal heart sounds and intact distal pulses.  Exam reveals no gallop and no friction rub.   No murmur heard. Pulmonary/Chest: Effort normal and breath sounds normal. No respiratory distress. She has no decreased breath sounds. She has no wheezes. She has no rhonchi. She has no rales.  Abdominal: Soft. Normal appearance and bowel sounds are normal. She exhibits no distension. There is no tenderness. There is no rigidity, no rebound and no guarding.  Musculoskeletal:       Right shoulder: She exhibits decreased range of motion, tenderness, bony tenderness and deformity. She exhibits normal pulse.       Right elbow: Normal.      Right wrist: Normal.       Arms: Diffuse R shoulder TTP with deformity, appears slightly inferiorly positioned, with tenderness over proximal humerus. No bruising or swelling noted. ROM limited due  to pain, pt guarding shoulder and unable to perform any ROM with it. Unable to assess shoulder strength, but distal strength intact. Sensation grossly intact although she reports some altered sensation to deltoid region of lateral upper arm. Elbow with no TTP, FROM intact, no bruising or deformity. Wrist nonTTP, FROM intact, wiggles all digits, cap refill brisk and present, distal pulses intact.  Neurological: She is alert and oriented to person, place, and time. She has normal strength. No sensory deficit.  Skin: Skin is warm, dry and intact. No rash noted.  Psychiatric: She has a normal mood and affect.  Nursing note and vitals reviewed.   ED Course  Reduction of dislocation Date/Time: 05/14/2014 10:06 PM Performed by: CAMPRUBI-SOMS, Stefany Starace STRUPP Authorized by: Ramond Marrow Consent: Verbal consent obtained. Written consent obtained. Risks and benefits: risks, benefits and alternatives were  discussed Consent given by: patient Patient understanding: patient states understanding of the procedure being performed Patient consent: the patient's understanding of the procedure matches consent given Procedure consent: procedure consent matches procedure scheduled Relevant documents: relevant documents present and verified Imaging studies: imaging studies available Required items: required blood products, implants, devices, and special equipment available Patient identity confirmed: arm band Time out: Immediately prior to procedure a "time out" was called to verify the correct patient, procedure, equipment, support staff and site/side marked as required. Patient sedated: yes Sedation type: moderate (conscious) sedation Sedatives: propofol Analgesia: dilaudid. Sedation start date/time: 05/14/2014 10:08 PM Sedation end date/time: 05/14/2014 10:21 PM Vitals: Vital signs were monitored during sedation. Patient tolerance: Patient tolerated the procedure well with no immediate complications Comments: R shoulder dislocation reduction using conscious sedation, and traction-counter traction technique   (including critical care time) Labs Review Labs Reviewed - No data to display  Imaging Review Dg Shoulder Right  05/14/2014   CLINICAL DATA:  Status post fall onto right shoulder, with right shoulder pain and deformity. Initial encounter.  EXAM: RIGHT SHOULDER - 2+ VIEW  COMPARISON:  None.  FINDINGS: There is anterior dislocation of the right humeral head. An underlying Hill-Sachs deformity is thought to be chronic in nature. No osseous Bankart lesion is seen.  No definite acute fractures are identified. The right acromioclavicular joint demonstrates minimal inferior osteophyte formation. No significant soft tissue abnormalities are characterized on radiograph. The visualized portions of the right lung are grossly clear.  IMPRESSION: Anterior dislocation of the right humeral head. Underlying  Hill-Sachs deformity is thought to be chronic in nature. No osseous Bankart lesion seen.   Electronically Signed   By: Roanna Raider M.D.   On: 05/14/2014 21:34   Dg Shoulder Right Port  05/14/2014   CLINICAL DATA:  Postreduction right shoulder.  EXAM: PORTABLE RIGHT SHOULDER - 2+ VIEW  COMPARISON:  05/14/2014  FINDINGS: Interval reduction of previous anterior right shoulder dislocation. Glenohumeral joint appears in place. Coracoclavicular and acromioclavicular spaces are maintained. No acute fracture demonstrated.  IMPRESSION: Interval relocation of the right shoulder.   Electronically Signed   By: Burman Nieves M.D.   On: 05/14/2014 23:44   Dg Humerus Right  05/14/2014   CLINICAL DATA:  Postreduction from previous right shoulder dislocation.  EXAM: RIGHT HUMERUS - 2+ VIEW  COMPARISON:  None.  FINDINGS: Focal Hill-Sachs deformity along the lateral aspect of the humeral head appears to be old. No evidence of acute fracture or dislocation of the right humerus. Soft tissues are unremarkable.  IMPRESSION: No acute bony abnormalities demonstrated.   Electronically Signed   By: Marisa Cyphers.D.  On: 05/14/2014 23:45     EKG Interpretation None      MDM   Final diagnoses:  Right arm pain  Shoulder dislocation, right, initial encounter  Fall as cause of accidental injury in home as place of occurrence    64 y.o. female with mechanical fall landing on R shoulder, now with slightly inferior positioning to shoulder and guarding movements. Some tenderness over humerus. Will obtain imaging. Will give morphine and obtain IV access in order to have it for possible procedural sedation. Neurovascularly intact at this time, although she endorses some tingling in the deltoid region. Will reassess shortly.  9:30 PM Xray reading pending, but initial impression is anterior dislocation. Due to patient discomfort, xray tech returned pt to room without doing humerus imaging, and will get humerus imaging  after post-reduction is done. Will give IV dilaudid now for improvement of pain, since 8mg  morphine 1hr ago didn't help enough. BP now 145/90, therefore initial HTN likely pain related. Will plan for procedural sedation with Dr. Linwood DibblesJon Knapp who has also seen and evaluated the patient.   10:47 PM Shoulder reduced and placed in sling-and-swathe. Portable xray obtained which appears to be relocated by my impression. Will await official reading. Neurovascularly intact after reduction and no longer experiencing altered sensation over deltoid. Somewhat nauseated after sedation, will give zofran and ice chips.  11:53 PM Post-reduction films read and reported as neg. Humerus xray also negative. Will send home with naprosyn and norco, discussed ice therapy, and sling use until she sees ortho in 1 week. I explained the diagnosis and have given explicit precautions to return to the ER including for any other new or worsening symptoms. The patient understands and accepts the medical plan as it's been dictated and I have answered their questions. Discharge instructions concerning home care and prescriptions have been given. The patient is STABLE and is discharged to home in good condition.  BP 130/70 mmHg  Pulse 63  Temp(Src) 97.8 F (36.6 C) (Oral)  Resp 14  Ht 5\' 2"  (1.575 m)  Wt 203 lb (92.08 kg)  BMI 37.12 kg/m2  SpO2 90%  Meds ordered this encounter  Medications  . morphine 4 MG/ML injection 8 mg    Sig:   . HYDROmorphone (DILAUDID) injection 1 mg    Sig:   . propofol (DIPRIVAN) 10 mg/mL bolus/IV push 50 mg    Sig:   . propofol (DIPRIVAN) 10 mg/mL bolus/IV push    Sig:   . ondansetron (ZOFRAN) injection 4 mg    Sig:   . naproxen (NAPROSYN) 500 MG tablet    Sig: Take 1 tablet (500 mg total) by mouth 2 (two) times daily as needed for mild pain, moderate pain or headache (TAKE WITH MEALS.).    Dispense:  20 tablet    Refill:  0    Order Specific Question:  Supervising Provider    Answer:   Eber HongMILLER, BRIAN D [[id number]]  . HYDROcodone-acetaminophen (NORCO) 5-325 MG per tablet    Sig: Take 1-2 tablets by mouth every 6 (six) hours as needed for severe pain.    Dispense:  20 tablet    Refill:  0    Order Specific Question:  Supervising Provider    Answer:  Vida RollerMILLER, BRIAN D [address][[id number]]     Kerstyn Coryell Strupp Camprubi-Soms, PA-C 05/14/14 [id number]

## 2014-05-14 NOTE — ED Provider Notes (Addendum)
Pt presented to the ED following a fall.  Acute shoulder pain and injury. Physical Exam  BP 134/67 mmHg  Pulse 62  Temp(Src) 97.8 F (36.6 C) (Oral)  Resp 15  Ht 5\' 2"  (1.575 m)  Wt 203 lb (92.08 kg)  BMI 37.12 kg/m2  SpO2 99%  Physical Exam  Constitutional: She appears well-developed and well-nourished. No distress.  HENT:  Head: Normocephalic and atraumatic.  Right Ear: External ear normal.  Left Ear: External ear normal.  Mouth/Throat: No oropharyngeal exudate.  mallampati 4   Eyes: Conjunctivae are normal. Right eye exhibits no discharge. Left eye exhibits no discharge. No scleral icterus.  Neck: Neck supple. No tracheal deviation present.  Cardiovascular: Normal rate and regular rhythm.   Pulmonary/Chest: Effort normal and breath sounds normal. No stridor. No respiratory distress.  Musculoskeletal: She exhibits no edema.  Neurological: She is alert. Cranial nerve deficit: no gross deficits.  Skin: Skin is warm and dry. No rash noted.  Psychiatric: She has a normal mood and affect.  Nursing note and vitals reviewed.   ED Course  Procedural sedation Date/Time: 05/14/2014 11:36 PM Performed by: Linwood DibblesKNAPP, Greely Atiyeh Authorized by: Linwood DibblesKNAPP, Vallerie Hentz Consent: Verbal consent obtained. Written consent obtained. Risks and benefits: risks, benefits and alternatives were discussed Consent given by: patient Patient identity confirmed: verbally with patient Time out: Immediately prior to procedure a "time out" was called to verify the correct patient, procedure, equipment, support staff and site/side marked as required. Patient sedated: yes Sedatives: propofol Sedation start date/time: 05/14/2014 10:06 PM Sedation end date/time: 05/14/2014 10:30 PM Vitals: Vital signs were monitored during sedation. Patient tolerance: Patient tolerated the procedure well with no immediate complications Comments: Brief episode of saturation in the high 80s.  Assisted ventilation with bag valve mask.  Pt tolerated  well.    MDM Pt tolerated shoulder reduction well.  Plan on repeat films.  Dc home with ortho follow up  I performed the sedation and assisted with the reduction.    examination/treatment/procedure(s) were conducted as a shared visit with non-physician practitioner(s) and myself.  I personally evaluated the patient during the encounter.   Linwood DibblesJon Favio Moder, MD 05/14/14 [id number]  Linwood DibblesJon Rhilyn Battle, MD 05/15/14 [id number]

## 2014-05-14 NOTE — Discharge Instructions (Signed)
Wear shoulder sling at all times until you see the orthopedist. Ice your shoulder throughout the day, using an ice pack for 20 minutes at a time every hour. Alternate between naprosyn and norco for pain relief. Do not drive or operate machinery with pain medication use. Call orthopedic follow up today or tomorrow to schedule followup appointment in 1 week for ongoing management of your shoulder dislocation. Return to the ER for changes or worsening symptoms.    Shoulder Dislocation Your shoulder is made up of three bones: the collar bone (clavicle); the shoulder blade (scapula), which includes the socket (glenoid cavity); and the upper arm bone (humerus). Your shoulder joint is the place where these bones meet. Strong, fibrous tissues hold these bones together (ligaments). Muscles and strong, fibrous tissues that connect the muscles to these bones (tendons) allow your arm to move through this joint. The range of motion of your shoulder joint is more extensive than most of your other joints, and the glenoid cavity is very shallow. That is the reason that your shoulder joint is one of the most unstable joints in your body. It is far more prone to dislocation than your other joints. Shoulder dislocation is when your humerus is forced out of your shoulder joint. CAUSES Shoulder dislocation is caused by a forceful impact on your shoulder. This impact usually is from an injury, such as a sports injury or a fall. SYMPTOMS Symptoms of shoulder dislocation include:  Deformity of your shoulder.  Intense pain.  Inability to move your shoulder joint.  Numbness, weakness, or tingling around your shoulder joint (your neck or down your arm).  Bruising or swelling around your shoulder. DIAGNOSIS In order to diagnose a dislocated shoulder, your caregiver will perform a physical exam. Your caregiver also may have an X-ray exam done to see if you have any broken bones. Magnetic resonance imaging (MRI) is a  procedure that sometimes is done to help your caregiver see any damage to the soft tissues around your shoulder, particularly your rotator cuff tendons. Additionally, your caregiver also may have electromyography done to measure the electrical discharges produced in your muscles if you have signs or symptoms of nerve damage. TREATMENT A shoulder dislocation is treated by placing the humerus back in the joint (reduction). Your caregiver does this either manually (closed reduction), by moving your humerus back into the joint through manipulation, or through surgery (open reduction). When your humerus is back in place, severe pain should improve almost immediately. You also may need to have surgery if you have a weak shoulder joint or ligaments, and you have recurring shoulder dislocations, despite rehabilitation. In rare cases, surgery is necessary if your nerves or blood vessels are damaged during the dislocation. After your reduction, your arm will be placed in a shoulder immobilizer or sling to keep it from moving. Your caregiver will have you wear your shoulder immobilizer or sling for 3 days to 3 weeks, depending on how serious your dislocation is. When your shoulder immobilizer or sling is removed, your caregiver may prescribe physical therapy to help improve the range of motion in your shoulder joint. HOME CARE INSTRUCTIONS  The following measures can help to reduce pain and speed up the healing process:  Rest your injured joint. Do not move it. Avoid activities similar to the one that caused your injury.  Apply ice to your injured joint for the first day or two after your reduction or as directed by your caregiver. Applying ice helps to reduce inflammation and  pain.  Put ice in a plastic bag.  Place a towel between your skin and the bag.  Leave the ice on for 15-20 minutes at a time, every 2 hours while you are awake.  Exercise your hand by squeezing a soft ball. This helps to eliminate  stiffness and swelling in your hand and wrist.  Take over-the-counter or prescription medicine for pain or discomfort as told by your caregiver. SEEK IMMEDIATE MEDICAL CARE IF:   Your shoulder immobilizer or sling becomes damaged.  Your pain becomes worse rather than better.  You lose feeling in your arm or hand, or they become white and cold. MAKE SURE YOU:   Understand these instructions.  Will watch your condition.  Will get help right away if you are not doing well or get worse. Document Released: 12/13/2000 Document Revised: 08/04/2013 Document Reviewed: 01/08/2011 [facility] Patient Information 2015 [place], [place]. This information is not intended to replace advice given to you by your health care provider. Make sure you discuss any questions you have with your health care provider.  Cryotherapy Cryotherapy is when you put ice on your injury. Ice helps lessen pain and puffiness (swelling) after an injury. Ice works the best when you start using it in the first 24 to 48 hours after an injury. HOME CARE  Put a dry or damp towel between the ice pack and your skin.  You may press gently on the ice pack.  Leave the ice on for no more than 10 to 20 minutes at a time.  Check your skin after 5 minutes to make sure your skin is okay.  Rest at least 20 minutes between ice pack uses.  Stop using ice when your skin loses feeling (numbness).  Do not use ice on someone who cannot tell you when it hurts. This includes small children and people with memory problems (dementia). GET HELP RIGHT AWAY IF:  You have white spots on your skin.  Your skin turns blue or pale.  Your skin feels waxy or hard.  Your puffiness gets worse. MAKE SURE YOU:   Understand these instructions.  Will watch your condition.  Will get help right away if you are not doing well or get worse. Document Released: 09/06/2007 Document Revised: 06/12/2011 Document Reviewed: 11/10/2010 [facility] Patient  Information 2015 [place], [place]. This information is not intended to replace advice given to you by your health care provider. Make sure you discuss any questions you have with your health care provider.

## 2014-07-17 ENCOUNTER — Other Ambulatory Visit: Payer: Self-pay | Admitting: Family Medicine

## 2014-07-17 DIAGNOSIS — Z136 Encounter for screening for cardiovascular disorders: Secondary | ICD-10-CM

## 2014-07-23 ENCOUNTER — Ambulatory Visit: Payer: BC Managed Care – PPO

## 2014-07-27 ENCOUNTER — Ambulatory Visit
Admission: RE | Admit: 2014-07-27 | Discharge: 2014-07-27 | Disposition: A | Discharge status: Home / Self Care | Payer: BC Managed Care – PPO | Source: Ambulatory Visit | Attending: Family Medicine | Admitting: Family Medicine

## 2014-07-27 DIAGNOSIS — Z136 Encounter for screening for cardiovascular disorders: Secondary | ICD-10-CM

## 2014-08-07 ENCOUNTER — Other Ambulatory Visit: Payer: Self-pay | Admitting: Orthopedic Surgery

## 2014-08-07 DIAGNOSIS — M25511 Pain in right shoulder: Secondary | ICD-10-CM

## 2014-08-12 ENCOUNTER — Other Ambulatory Visit (HOSPITAL_COMMUNITY): Payer: Self-pay | Admitting: Orthopedic Surgery

## 2014-08-12 DIAGNOSIS — M25511 Pain in right shoulder: Secondary | ICD-10-CM

## 2014-09-22 DIAGNOSIS — G8918 Other acute postprocedural pain: Secondary | ICD-10-CM | POA: Diagnosis not present

## 2014-09-22 DIAGNOSIS — M75101 Unspecified rotator cuff tear or rupture of right shoulder, not specified as traumatic: Secondary | ICD-10-CM | POA: Diagnosis not present

## 2014-09-22 DIAGNOSIS — M7501 Adhesive capsulitis of right shoulder: Secondary | ICD-10-CM | POA: Diagnosis not present

## 2014-09-22 DIAGNOSIS — M7541 Impingement syndrome of right shoulder: Secondary | ICD-10-CM | POA: Diagnosis not present

## 2014-11-23 DIAGNOSIS — M25511 Pain in right shoulder: Secondary | ICD-10-CM | POA: Diagnosis not present

## 2014-11-26 DIAGNOSIS — M25511 Pain in right shoulder: Secondary | ICD-10-CM | POA: Diagnosis not present

## 2014-11-30 DIAGNOSIS — M25511 Pain in right shoulder: Secondary | ICD-10-CM | POA: Diagnosis not present

## 2014-12-02 DIAGNOSIS — M25511 Pain in right shoulder: Secondary | ICD-10-CM | POA: Diagnosis not present

## 2014-12-08 DIAGNOSIS — M25511 Pain in right shoulder: Secondary | ICD-10-CM | POA: Diagnosis not present

## 2014-12-10 DIAGNOSIS — M25511 Pain in right shoulder: Secondary | ICD-10-CM | POA: Diagnosis not present

## 2014-12-14 DIAGNOSIS — M25511 Pain in right shoulder: Secondary | ICD-10-CM | POA: Diagnosis not present

## 2014-12-16 DIAGNOSIS — M25511 Pain in right shoulder: Secondary | ICD-10-CM | POA: Diagnosis not present

## 2014-12-21 DIAGNOSIS — M25511 Pain in right shoulder: Secondary | ICD-10-CM | POA: Diagnosis not present

## 2014-12-23 DIAGNOSIS — M25511 Pain in right shoulder: Secondary | ICD-10-CM | POA: Diagnosis not present

## 2014-12-28 DIAGNOSIS — M25511 Pain in right shoulder: Secondary | ICD-10-CM | POA: Diagnosis not present

## 2014-12-30 DIAGNOSIS — M25511 Pain in right shoulder: Secondary | ICD-10-CM | POA: Diagnosis not present

## 2015-01-04 DIAGNOSIS — M25511 Pain in right shoulder: Secondary | ICD-10-CM | POA: Diagnosis not present

## 2015-01-05 DIAGNOSIS — E039 Hypothyroidism, unspecified: Secondary | ICD-10-CM | POA: Diagnosis not present

## 2015-01-05 DIAGNOSIS — Z6835 Body mass index (BMI) 35.0-35.9, adult: Secondary | ICD-10-CM | POA: Diagnosis not present

## 2015-01-05 DIAGNOSIS — R030 Elevated blood-pressure reading, without diagnosis of hypertension: Secondary | ICD-10-CM | POA: Diagnosis not present

## 2015-01-05 DIAGNOSIS — E669 Obesity, unspecified: Secondary | ICD-10-CM | POA: Diagnosis not present

## 2015-01-05 DIAGNOSIS — N393 Stress incontinence (female) (male): Secondary | ICD-10-CM | POA: Diagnosis not present

## 2015-01-05 DIAGNOSIS — N8110 Cystocele, unspecified: Secondary | ICD-10-CM | POA: Diagnosis not present

## 2015-01-05 DIAGNOSIS — E782 Mixed hyperlipidemia: Secondary | ICD-10-CM | POA: Diagnosis not present

## 2015-01-05 DIAGNOSIS — Z23 Encounter for immunization: Secondary | ICD-10-CM | POA: Diagnosis not present

## 2015-01-05 DIAGNOSIS — Z0000 Encounter for general adult medical examination without abnormal findings: Secondary | ICD-10-CM | POA: Diagnosis not present

## 2015-01-05 DIAGNOSIS — M1990 Unspecified osteoarthritis, unspecified site: Secondary | ICD-10-CM | POA: Diagnosis not present

## 2015-01-05 DIAGNOSIS — J45909 Unspecified asthma, uncomplicated: Secondary | ICD-10-CM | POA: Diagnosis not present

## 2015-01-05 DIAGNOSIS — Z1211 Encounter for screening for malignant neoplasm of colon: Secondary | ICD-10-CM | POA: Diagnosis not present

## 2015-01-06 DIAGNOSIS — M25511 Pain in right shoulder: Secondary | ICD-10-CM | POA: Diagnosis not present

## 2015-01-11 DIAGNOSIS — M25511 Pain in right shoulder: Secondary | ICD-10-CM | POA: Diagnosis not present

## 2015-01-13 DIAGNOSIS — M25511 Pain in right shoulder: Secondary | ICD-10-CM | POA: Diagnosis not present

## 2015-01-18 DIAGNOSIS — M25511 Pain in right shoulder: Secondary | ICD-10-CM | POA: Diagnosis not present

## 2015-01-20 DIAGNOSIS — M25511 Pain in right shoulder: Secondary | ICD-10-CM | POA: Diagnosis not present

## 2015-01-25 DIAGNOSIS — M25511 Pain in right shoulder: Secondary | ICD-10-CM | POA: Diagnosis not present

## 2015-01-27 DIAGNOSIS — M25511 Pain in right shoulder: Secondary | ICD-10-CM | POA: Diagnosis not present

## 2015-02-01 DIAGNOSIS — Z09 Encounter for follow-up examination after completed treatment for conditions other than malignant neoplasm: Secondary | ICD-10-CM | POA: Diagnosis not present

## 2015-02-02 DIAGNOSIS — M25511 Pain in right shoulder: Secondary | ICD-10-CM | POA: Diagnosis not present

## 2015-02-04 DIAGNOSIS — M25511 Pain in right shoulder: Secondary | ICD-10-CM | POA: Diagnosis not present

## 2015-02-16 DIAGNOSIS — M25511 Pain in right shoulder: Secondary | ICD-10-CM | POA: Diagnosis not present

## 2015-02-18 DIAGNOSIS — M25511 Pain in right shoulder: Secondary | ICD-10-CM | POA: Diagnosis not present

## 2015-04-06 DIAGNOSIS — J069 Acute upper respiratory infection, unspecified: Secondary | ICD-10-CM | POA: Diagnosis not present

## 2015-04-14 DIAGNOSIS — L0390 Cellulitis, unspecified: Secondary | ICD-10-CM | POA: Diagnosis not present

## 2015-04-14 DIAGNOSIS — R05 Cough: Secondary | ICD-10-CM | POA: Diagnosis not present

## 2015-05-16 DIAGNOSIS — J209 Acute bronchitis, unspecified: Secondary | ICD-10-CM | POA: Diagnosis not present

## 2015-05-16 DIAGNOSIS — J019 Acute sinusitis, unspecified: Secondary | ICD-10-CM | POA: Diagnosis not present

## 2015-05-16 DIAGNOSIS — R05 Cough: Secondary | ICD-10-CM | POA: Diagnosis not present

## 2015-05-16 DIAGNOSIS — R509 Fever, unspecified: Secondary | ICD-10-CM | POA: Diagnosis not present

## 2015-07-07 DIAGNOSIS — E039 Hypothyroidism, unspecified: Secondary | ICD-10-CM | POA: Diagnosis not present

## 2015-07-07 DIAGNOSIS — E782 Mixed hyperlipidemia: Secondary | ICD-10-CM | POA: Diagnosis not present

## 2015-07-07 DIAGNOSIS — R7301 Impaired fasting glucose: Secondary | ICD-10-CM | POA: Diagnosis not present

## 2015-07-07 DIAGNOSIS — E669 Obesity, unspecified: Secondary | ICD-10-CM | POA: Diagnosis not present

## 2015-07-07 DIAGNOSIS — J45909 Unspecified asthma, uncomplicated: Secondary | ICD-10-CM | POA: Diagnosis not present

## 2015-07-07 DIAGNOSIS — Z6837 Body mass index (BMI) 37.0-37.9, adult: Secondary | ICD-10-CM | POA: Diagnosis not present

## 2015-07-07 DIAGNOSIS — R030 Elevated blood-pressure reading, without diagnosis of hypertension: Secondary | ICD-10-CM | POA: Diagnosis not present

## 2015-08-18 DIAGNOSIS — J45909 Unspecified asthma, uncomplicated: Secondary | ICD-10-CM | POA: Diagnosis not present

## 2015-08-18 DIAGNOSIS — R030 Elevated blood-pressure reading, without diagnosis of hypertension: Secondary | ICD-10-CM | POA: Diagnosis not present

## 2015-08-18 DIAGNOSIS — R0609 Other forms of dyspnea: Secondary | ICD-10-CM | POA: Diagnosis not present

## 2015-08-19 ENCOUNTER — Telehealth: Payer: Self-pay | Admitting: Cardiovascular Disease

## 2015-08-19 NOTE — Telephone Encounter (Signed)
Received [facility] from [facility] for appointment on 10/07/15 with Dr Royann Shiversroitoru.  [facility] for Dr Croitoru's schedule on 10/07/15. lp

## 2015-09-16 DIAGNOSIS — E785 Hyperlipidemia, unspecified: Secondary | ICD-10-CM | POA: Diagnosis not present

## 2015-09-16 DIAGNOSIS — E079 Disorder of thyroid, unspecified: Secondary | ICD-10-CM | POA: Diagnosis not present

## 2015-09-16 DIAGNOSIS — L299 Pruritus, unspecified: Secondary | ICD-10-CM | POA: Diagnosis not present

## 2015-10-07 ENCOUNTER — Ambulatory Visit (INDEPENDENT_AMBULATORY_CARE_PROVIDER_SITE_OTHER): Payer: BC Managed Care – PPO | Admitting: Cardiovascular Disease

## 2015-10-07 ENCOUNTER — Encounter: Payer: Self-pay | Admitting: Cardiovascular Disease

## 2015-10-07 VITALS — BP 147/80 | HR 69 | Ht 62.0 in | Wt 204.2 lb

## 2015-10-07 DIAGNOSIS — R9431 Abnormal electrocardiogram [ECG] [EKG]: Secondary | ICD-10-CM | POA: Diagnosis not present

## 2015-10-07 DIAGNOSIS — E785 Hyperlipidemia, unspecified: Secondary | ICD-10-CM | POA: Diagnosis not present

## 2015-10-07 DIAGNOSIS — R0609 Other forms of dyspnea: Secondary | ICD-10-CM

## 2015-10-07 DIAGNOSIS — E669 Obesity, unspecified: Secondary | ICD-10-CM | POA: Diagnosis not present

## 2015-10-07 NOTE — Patient Instructions (Signed)
Medication Instructions: Dr Royann Shiversroitoru recommends that you continue on your current medications as directed. Please refer to the Current Medication list given to you today.  Labwork: NONE ORDERED  Testing/Procedures: 1. Echocardiogram - Your physician has requested that you have an echocardiogram. Echocardiography is a painless test that uses sound waves to create images of your heart. It provides your doctor with information about the size and shape of your heart and how well your heart's chambers and valves are working. This procedure takes approximately one hour. There are no restrictions for this procedure. [facility] done at [facility] - [address], [address].  2. Eugenie BirksLexiscan Myoview Stress test - Your physician has requested that you have a lexiscan myoview. For further information please visit [email or web address]. Please follow instruction sheet, as given.  Follow-up: Dr Royann Shiversroitoru recommends that you schedule a follow-up appointment first available.  If you need a refill on your cardiac medications before your next appointment, please call your pharmacy.

## 2015-10-07 NOTE — Progress Notes (Signed)
Cardiology consultation Note    Date:  10/08/2015   ID:  Adriana Oliver, DOB [birth year], MRN [id number]  PCP:  Lupita Raider, MD  Cardiologist:   Thurmon Fair, MD  Reason for consultation: Exertional dyspnea, abnormal ECG   Chief Complaint  Patient presents with  . New Evaluation  . Shortness of Breath    History of Present Illness:  Adriana Oliver is a 65 y.o. female With hypercholesterolemia, treated hypothyroidism and long-standing history of reactive airway disease presents for evaluation of shortness of breath and an abnormal electrocardiogram.  She has noticed worsening dyspnea. This seems to meet criteria for functional class II. She is able to climb the 17th steps in her home but then has to immediately rest. She does not have dyspnea at rest. She does have episodes of wheezing, but exertional dyspnea will occur even in the absence of wheezing. She has never smoked but was exposed to secondhand smoke for a very long time. Previous evaluation by pulmonology (Dr. Sherene Sires) described her as having moderate-severe persistent asthma. She does believe that albuterol generally helps her dyspnea. In 2013 Pulmonary function tests showed an FEV1 at 68% of predicted.  Several years ago she had a stress test with Dr. Verdis Prime which was described as normal.Sshe does not remember why the study was performed at that time. She thinks she's had an echocardiogram in the past, but I cannot find any record in multiple databases. She had a normal ultrasound screening for abdominal aortic aneurysm in 2016.  She does not have angina pectoris either at rest or with exertion. She also denies palpitations, syncope, focal neurological deficits, intermittent claudication or leg edema. Otherwise chest complaints related mostly to orthopedic problems. She had shoulder surgery in 2016 and back surgery 1997.  Her electrocardiogram shows sinus rhythm with prominent T-wave inversions in leads V3 - V6, I,  II, III. the QTC is borderline prolonged at 465 ms. She had a very similar ECG when she had her normal treadmill stress test and August 2011. The T-wave inversion resolved with exercise. Less prominent T-wave inversion was seen on a more recent electrocardiogram from 08/19/2015.  Her father had coronary disease receiving stents at a very advanced age, but 3 of her paternal uncles had myocardial infarctions in their 18s and early 17s, including one who had sudden cardiac death at age 54.     Past Medical History  Diagnosis Date  . Incontinence   . Hypothyroidism   . Obesity   . Hyperlipidemia   . COPD (chronic obstructive pulmonary disease) (HCC)   . Prediabetes   . Osteopenia   . DOE (dyspnea on exertion)     Past Surgical History  Procedure Laterality Date  . Rhinoplasty    . Appendectomy      Current Medications: Outpatient Prescriptions Prior to Visit  Medication Sig Dispense Refill  . acetaminophen (TYLENOL) 650 MG CR tablet Take 650 mg by mouth every 8 (eight) hours as needed.    Marland Kitchen albuterol (PROAIR HFA) 108 (90 Base) MCG/ACT inhaler Inhale 2 puffs into the lungs 4 (four) times daily as needed.    . Alpha-D-Galactosidase (BEANO) TABS As directed when needed    . benzonatate (TESSALON) 100 MG capsule Take 100 mg by mouth 3 (three) times daily as needed.     . Docosanol (ABREVA EX) Apply topically. As directed as needed    . DULERA 100-5 MCG/ACT AERO inhale 1 to 2 puffs by mouth twice a day 1 Inhaler 5  .  omeprazole (PRILOSEC) 20 MG capsule before first and last meals daily 60 capsule 11  . levothyroxine (SYNTHROID, LEVOTHROID) 88 MCG tablet Take 1 tablet by mouth daily.    . fexofenadine (ALLEGRA) 180 MG tablet Take 180 mg by mouth daily. Reported on 10/07/2015    . fluticasone furoate-vilanterol (BREO ELLIPTA) 100-25 MCG/INH AEPB Take 1 Inhaler by mouth daily.    Marland Kitchen. HYDROcodone-acetaminophen (NORCO) 5-325 MG per tablet Take 1-2 tablets by mouth every 6 (six) hours as needed  for severe pain. 20 tablet 0  . HYDROcodone-homatropine (HYCODAN) 5-1.5 MG/5ML syrup 5 mLs every 4 (four) hours as needed.     Marland Kitchen. levothyroxine (SYNTHROID, LEVOTHROID) 75 MCG tablet Take 75 mcg by mouth daily.    Marland Kitchen. loratadine (CLARITIN) 10 MG tablet take 1 tablet by mouth once daily 30 tablet 0  . naproxen (NAPROSYN) 500 MG tablet Take 1 tablet (500 mg total) by mouth 2 (two) times daily as needed for mild pain, moderate pain or headache (TAKE WITH MEALS.). 20 tablet 0  . Naproxen Sodium (ALEVE) 220 MG CAPS Per bottle directions as needed    . Phenylephrine-DM-GG-APAP (RA MUCUS RELIEF PLUS) 5-10-200-325 MG TABS Take 1 tablet by mouth 2 (two) times daily.    . pravastatin (PRAVACHOL) 20 MG tablet Take 1 tablet by mouth daily.    . pravastatin (PRAVACHOL) 20 MG tablet Take 20 mg by mouth daily.    . predniSONE (DELTASONE) 10 MG tablet 4 tabs for 2 days, then 3 tabs for 2 days, 2 tabs for 2 days, then 1 tab for 2 days, then stop 20 tablet 0   No facility-administered medications prior to visit.     Allergies:   Nasal spray; Oxycodone-acetaminophen; and Peg 3350-kcl-na bicarb-nacl   Social History   Social History  . Marital Status: Married    Spouse Name: N/A  . Number of Children: 4  . Years of Education: N/A   Occupational History  . Teacher    Social History Main Topics  . Smoking status: Never Smoker   . Smokeless tobacco: Never Used  . Alcohol Use: No  . Drug Use: No  . Sexual Activity: Not Asked   Other Topics Concern  . None   Social History Narrative     Family History:  The patient's family history includes Arthritis in her sister; Asthma in her mother; COPD in her mother; Diabetes in her sister and sister; Emphysema in her mother. Early death from coronary disease and 3 paternal uncles  ROS:   Please see the history of present illness.    ROS All other systems reviewed and are negative.   PHYSICAL EXAM:   VS:  BP 147/80 mmHg  Pulse 69  Ht 5\' 2"  (1.575 m)  Wt  92.625 kg (204 lb 3.2 oz)  BMI 37.34 kg/m2   GEN: Well nourished, well developed, in no acute distress HEENT: normal Neck: no JVD, carotid bruits, or masses Cardiac: RRR; no murmurs, rubs, or gallops,no edema  Respiratory:  clear to auscultation bilaterally, normal work of breathing GI: soft, nontender, nondistended, + BS MS: no deformity or atrophy Skin: warm and dry, no rash Neuro:  Alert and Oriented x 3, Strength and sensation are intact Psych: euthymic mood, full affect  Wt Readings from Last 3 Encounters:  10/07/15 92.625 kg (204 lb 3.2 oz)  05/14/14 92.08 kg (203 lb)  04/19/12 95.618 kg (210 lb 12.8 oz)      Studies/Labs Reviewed:   EKG:  EKG is ordered today.  The ekg ordered today demonstrates Sinus rhythm, T-wave inversion in multiple leads, most prominently in V3-V6 but also in the inferior leads, QTC 465 ms  Recent Labs: Glucose 87, hemoglobin A1c 5.5%, creatinine 0.65, potassium 3.9, normal liver function tests  Lipid Panel Total cholesterol 240, triglycerides 301, HDL 41, LDL 138 (off statin therapy, subsequently restarted)  Additional studies/ records that were reviewed today include:  Records from Dr. Clelia CroftShaw   ASSESSMENT:    1. Dyspnea on exertion   2. Abnormal ECG   3. Hyperlipidemia   4. Obesity (BMI 35.0-39.9 without comorbidity) (HCC)      PLAN:  In order of problems listed above:  1. Dyspnea may be at least in part related to asthma, but the fairly consistent exertional pattern suggests possible cardiac etiology. Recommend an echocardiogram. 2. The abnormal ECG changes are nonspecific but are quite concerning for coronary insufficiency. Recommend a functional study. I don't think we will have much success with a treadmill stress test in achieving the necessary exercise level. Recommend a [employer] Myoview. She does not use her rescue inhaler on a daily basis and is not wheezing today. I told her to bring the inhaler for the test. 3. Lipid abnormalities  are severe enough to justify statin therapy even in the absence of established vascular disease. I agree with the decision to place her on statin. 4. Weight loss strongly recommended. It is likely worsening her exertional dyspnea and metabolic risk profile.    Medication Adjustments/Labs and Tests Ordered: Current medicines are reviewed at length with the patient today.  Concerns regarding medicines are outlined above.  Medication changes, Labs and Tests ordered today are listed in the Patient Instructions below. Patient Instructions  Medication Instructions: Dr Royann Shiversroitoru recommends that you continue on your current medications as directed. Please refer to the Current Medication list given to you today.  Labwork: NONE ORDERED  Testing/Procedures: 1. Echocardiogram - Your physician has requested that you have an echocardiogram. Echocardiography is a painless test that uses sound waves to create images of your heart. It provides your doctor with information about the size and shape of your heart and how well your heart's chambers and valves are working. This procedure takes approximately one hour. There are no restrictions for this procedure. [facility] - [address], [address].  2. Eugenie [employer]ksLexiscan Myoview Stress test - Your physician has requested that you have a lexiscan myoview. For further information please visit [email or web address]. Please follow instruction sheet, as given.  Follow-up: Dr Royann Shiversroitoru recommends that you schedule a follow-up appointment first available.  If you need a refill on your cardiac medications before your next appointment, please call your pharmacy.     Signed, Thurmon FairMihai Fredderick Swanger, MD  10/08/2015 7:41 PM    [facility] [address], [place], [place]  [address] Phone: [phone]) [phone]; Fax: [phone]

## 2015-10-08 DIAGNOSIS — E785 Hyperlipidemia, unspecified: Secondary | ICD-10-CM | POA: Insufficient documentation

## 2015-10-08 DIAGNOSIS — E669 Obesity, unspecified: Secondary | ICD-10-CM | POA: Insufficient documentation

## 2015-10-08 DIAGNOSIS — R0609 Other forms of dyspnea: Secondary | ICD-10-CM

## 2015-10-08 DIAGNOSIS — R9431 Abnormal electrocardiogram [ECG] [EKG]: Secondary | ICD-10-CM | POA: Insufficient documentation

## 2015-10-14 ENCOUNTER — Telehealth (HOSPITAL_COMMUNITY): Payer: Self-pay

## 2015-10-14 NOTE — Telephone Encounter (Signed)
Encounter complete. 

## 2015-10-19 ENCOUNTER — Ambulatory Visit (HOSPITAL_COMMUNITY)
Admission: RE | Admit: 2015-10-19 | Discharge: 2015-10-19 | Disposition: A | Discharge status: Home / Self Care | Payer: BC Managed Care – PPO | Source: Ambulatory Visit | Attending: Cardiology | Admitting: Cardiology

## 2015-10-19 DIAGNOSIS — E669 Obesity, unspecified: Secondary | ICD-10-CM | POA: Diagnosis not present

## 2015-10-19 DIAGNOSIS — R0609 Other forms of dyspnea: Secondary | ICD-10-CM | POA: Diagnosis not present

## 2015-10-19 DIAGNOSIS — Z6837 Body mass index (BMI) 37.0-37.9, adult: Secondary | ICD-10-CM | POA: Diagnosis not present

## 2015-10-19 DIAGNOSIS — R5383 Other fatigue: Secondary | ICD-10-CM | POA: Diagnosis not present

## 2015-10-19 LAB — MYOCARDIAL PERFUSION IMAGING
CHL CUP NUCLEAR SDS: 0
CHL CUP NUCLEAR SRS: 0
CHL CUP RESTING HR STRESS: 70 {beats}/min
LV sys vol: 28 mL
LVDIAVOL: 79 mL (ref 46–106)
Peak HR: 94 {beats}/min
SSS: 0
TID: 1.16

## 2015-10-19 MED ORDER — TECHNETIUM TC 99M TETROFOSMIN IV KIT
32.0000 | PACK | Freq: Once | INTRAVENOUS | Status: AC | PRN
  Administered 2015-10-19: 32 via INTRAVENOUS
  Filled 2015-10-19: qty 32

## 2015-10-19 MED ORDER — TECHNETIUM TC 99M TETROFOSMIN IV KIT
11.0000 | PACK | Freq: Once | INTRAVENOUS | Status: AC | PRN
  Administered 2015-10-19: 11 via INTRAVENOUS
  Filled 2015-10-19: qty 11

## 2015-10-19 MED ORDER — REGADENOSON 0.4 MG/5ML IV SOLN
0.4000 mg | Freq: Once | INTRAVENOUS | Status: AC
  Administered 2015-10-19: 0.4 mg via INTRAVENOUS

## 2015-10-25 ENCOUNTER — Ambulatory Visit (HOSPITAL_COMMUNITY): Payer: BC Managed Care – PPO | Attending: Internal Medicine

## 2015-10-25 ENCOUNTER — Other Ambulatory Visit (HOSPITAL_COMMUNITY): Payer: Self-pay

## 2015-10-25 DIAGNOSIS — I517 Cardiomegaly: Secondary | ICD-10-CM | POA: Insufficient documentation

## 2015-10-25 DIAGNOSIS — J449 Chronic obstructive pulmonary disease, unspecified: Secondary | ICD-10-CM | POA: Diagnosis not present

## 2015-10-25 DIAGNOSIS — I351 Nonrheumatic aortic (valve) insufficiency: Secondary | ICD-10-CM | POA: Diagnosis not present

## 2015-10-25 DIAGNOSIS — R0600 Dyspnea, unspecified: Secondary | ICD-10-CM | POA: Insufficient documentation

## 2015-10-25 DIAGNOSIS — R0609 Other forms of dyspnea: Secondary | ICD-10-CM | POA: Diagnosis not present

## 2015-10-25 DIAGNOSIS — E785 Hyperlipidemia, unspecified: Secondary | ICD-10-CM | POA: Insufficient documentation

## 2015-11-16 ENCOUNTER — Other Ambulatory Visit: Payer: Self-pay | Admitting: Physician Assistant

## 2015-11-16 ENCOUNTER — Ambulatory Visit
Admission: RE | Admit: 2015-11-16 | Discharge: 2015-11-16 | Disposition: A | Discharge status: Home / Self Care | Payer: BC Managed Care – PPO | Source: Ambulatory Visit | Attending: Physician Assistant | Admitting: Physician Assistant

## 2015-11-16 DIAGNOSIS — M25531 Pain in right wrist: Secondary | ICD-10-CM

## 2015-11-16 DIAGNOSIS — M6749 Ganglion, multiple sites: Secondary | ICD-10-CM | POA: Diagnosis not present

## 2015-11-16 DIAGNOSIS — L259 Unspecified contact dermatitis, unspecified cause: Secondary | ICD-10-CM | POA: Diagnosis not present

## 2015-11-25 ENCOUNTER — Ambulatory Visit: Payer: BC Managed Care – PPO | Admitting: Cardiovascular Disease

## 2016-01-11 DIAGNOSIS — E782 Mixed hyperlipidemia: Secondary | ICD-10-CM | POA: Diagnosis not present

## 2016-01-11 DIAGNOSIS — Z0000 Encounter for general adult medical examination without abnormal findings: Secondary | ICD-10-CM | POA: Diagnosis not present

## 2016-01-11 DIAGNOSIS — G4700 Insomnia, unspecified: Secondary | ICD-10-CM | POA: Diagnosis not present

## 2016-01-11 DIAGNOSIS — N393 Stress incontinence (female) (male): Secondary | ICD-10-CM | POA: Diagnosis not present

## 2016-01-11 DIAGNOSIS — M899 Disorder of bone, unspecified: Secondary | ICD-10-CM | POA: Diagnosis not present

## 2016-01-11 DIAGNOSIS — N3281 Overactive bladder: Secondary | ICD-10-CM | POA: Diagnosis not present

## 2016-01-11 DIAGNOSIS — Z23 Encounter for immunization: Secondary | ICD-10-CM | POA: Diagnosis not present

## 2016-01-11 DIAGNOSIS — M1990 Unspecified osteoarthritis, unspecified site: Secondary | ICD-10-CM | POA: Diagnosis not present

## 2016-01-11 DIAGNOSIS — E039 Hypothyroidism, unspecified: Secondary | ICD-10-CM | POA: Diagnosis not present

## 2016-01-11 DIAGNOSIS — J45909 Unspecified asthma, uncomplicated: Secondary | ICD-10-CM | POA: Diagnosis not present

## 2016-01-11 DIAGNOSIS — E669 Obesity, unspecified: Secondary | ICD-10-CM | POA: Diagnosis not present

## 2016-01-11 DIAGNOSIS — R7301 Impaired fasting glucose: Secondary | ICD-10-CM | POA: Diagnosis not present

## 2016-03-01 DIAGNOSIS — J069 Acute upper respiratory infection, unspecified: Secondary | ICD-10-CM | POA: Diagnosis not present

## 2016-03-21 DIAGNOSIS — J45909 Unspecified asthma, uncomplicated: Secondary | ICD-10-CM | POA: Diagnosis not present

## 2016-03-21 DIAGNOSIS — J329 Chronic sinusitis, unspecified: Secondary | ICD-10-CM | POA: Diagnosis not present

## 2016-04-27 IMAGING — CR DG SHOULDER 2+V PORT*R*
1 series · 1 of 1 positions shown · non-contrast
Comparison: 05/14/2014

CLINICAL DATA: Postreduction right shoulder.

EXAM:
PORTABLE RIGHT SHOULDER - 2+ VIEW

[AP]
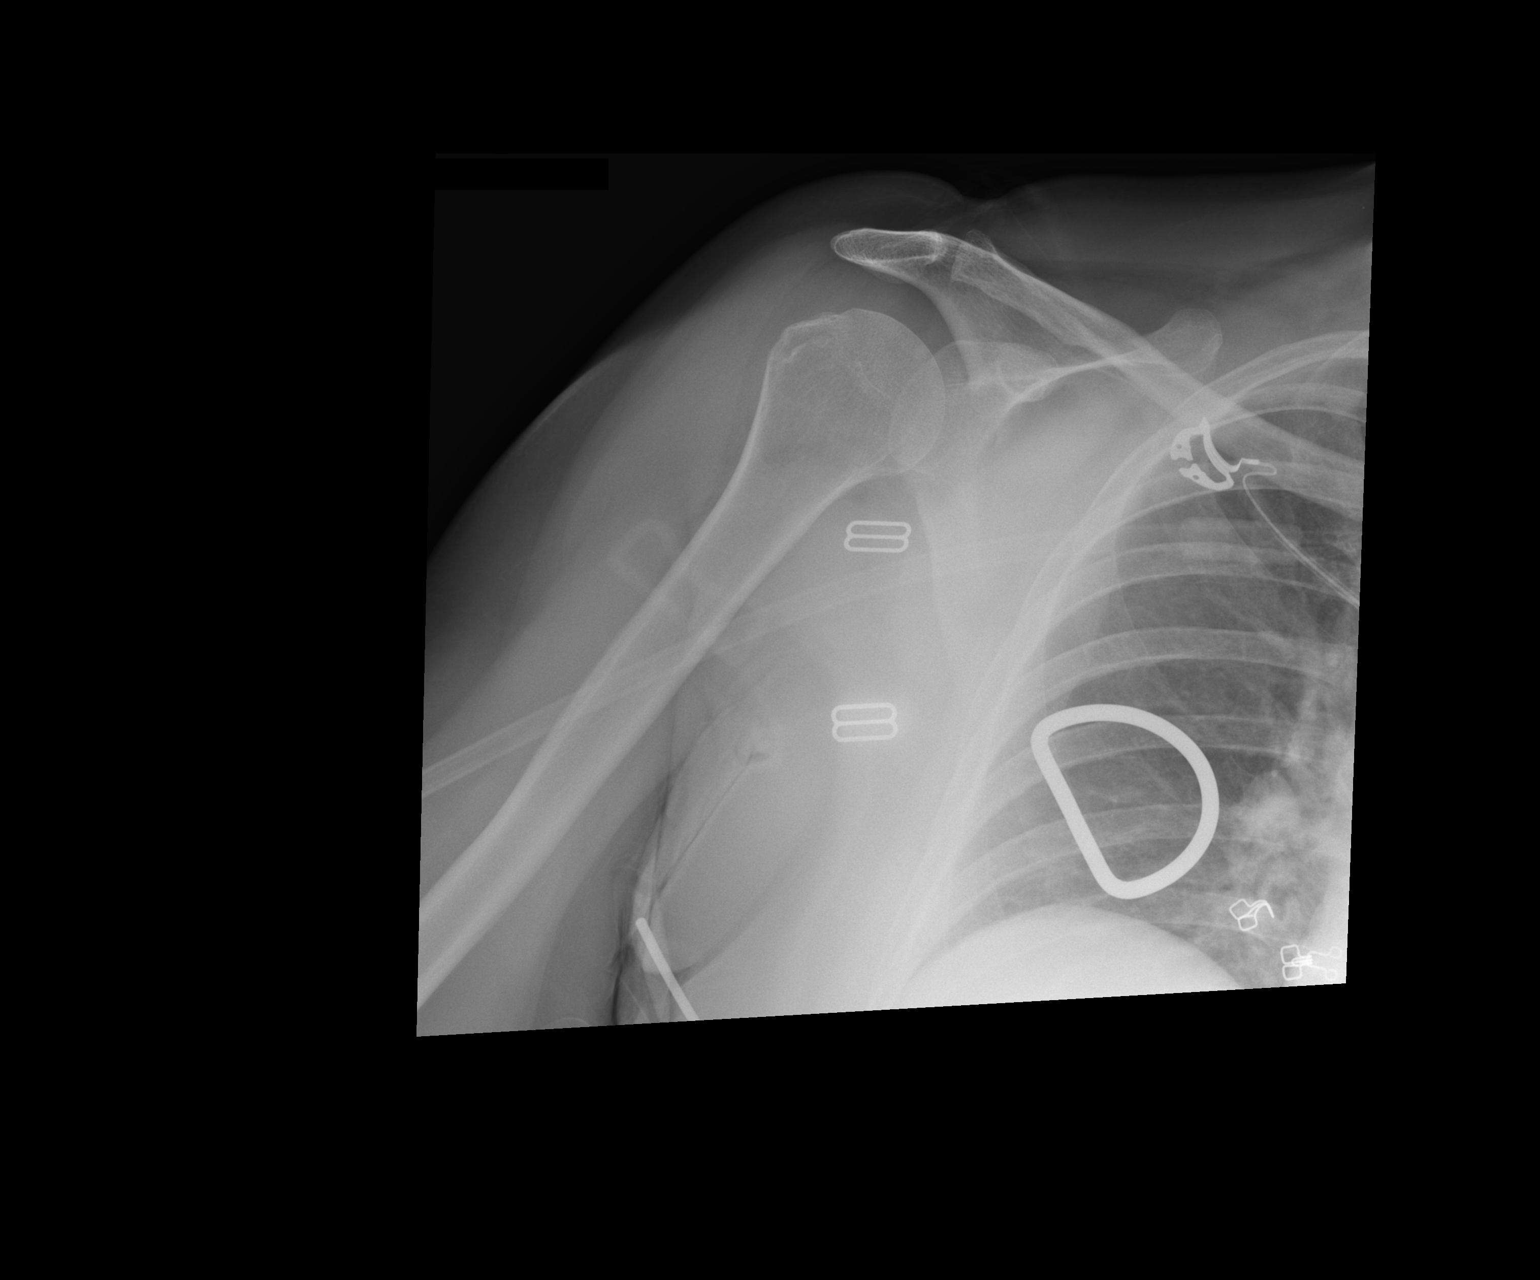

[1 of 1 positions shown; findings below may reference images not displayed]

FINDINGS: Interval reduction of previous anterior right shoulder dislocation.
Glenohumeral joint appears in place. Coracoclavicular and
acromioclavicular spaces are maintained. No acute fracture
demonstrated.
IMPRESSION: Interval relocation of the right shoulder.

## 2016-07-17 DIAGNOSIS — E039 Hypothyroidism, unspecified: Secondary | ICD-10-CM | POA: Diagnosis not present

## 2016-07-17 DIAGNOSIS — E669 Obesity, unspecified: Secondary | ICD-10-CM | POA: Diagnosis not present

## 2016-07-17 DIAGNOSIS — R7301 Impaired fasting glucose: Secondary | ICD-10-CM | POA: Diagnosis not present

## 2016-07-17 DIAGNOSIS — E782 Mixed hyperlipidemia: Secondary | ICD-10-CM | POA: Diagnosis not present

## 2016-07-17 DIAGNOSIS — M899 Disorder of bone, unspecified: Secondary | ICD-10-CM | POA: Diagnosis not present

## 2016-07-17 DIAGNOSIS — J45909 Unspecified asthma, uncomplicated: Secondary | ICD-10-CM | POA: Diagnosis not present

## 2017-01-17 DIAGNOSIS — M899 Disorder of bone, unspecified: Secondary | ICD-10-CM | POA: Diagnosis not present

## 2017-01-17 DIAGNOSIS — M1990 Unspecified osteoarthritis, unspecified site: Secondary | ICD-10-CM | POA: Diagnosis not present

## 2017-01-17 DIAGNOSIS — Z0000 Encounter for general adult medical examination without abnormal findings: Secondary | ICD-10-CM | POA: Diagnosis not present

## 2017-01-17 DIAGNOSIS — I10 Essential (primary) hypertension: Secondary | ICD-10-CM | POA: Diagnosis not present

## 2017-01-17 DIAGNOSIS — E039 Hypothyroidism, unspecified: Secondary | ICD-10-CM | POA: Diagnosis not present

## 2017-01-17 DIAGNOSIS — N393 Stress incontinence (female) (male): Secondary | ICD-10-CM | POA: Diagnosis not present

## 2017-01-17 DIAGNOSIS — E782 Mixed hyperlipidemia: Secondary | ICD-10-CM | POA: Diagnosis not present

## 2017-01-17 DIAGNOSIS — R7301 Impaired fasting glucose: Secondary | ICD-10-CM | POA: Diagnosis not present

## 2017-01-17 DIAGNOSIS — N3281 Overactive bladder: Secondary | ICD-10-CM | POA: Diagnosis not present

## 2017-01-17 DIAGNOSIS — J45909 Unspecified asthma, uncomplicated: Secondary | ICD-10-CM | POA: Diagnosis not present

## 2017-01-17 DIAGNOSIS — Z23 Encounter for immunization: Secondary | ICD-10-CM | POA: Diagnosis not present

## 2017-01-17 DIAGNOSIS — E669 Obesity, unspecified: Secondary | ICD-10-CM | POA: Diagnosis not present

## 2017-04-02 DIAGNOSIS — R05 Cough: Secondary | ICD-10-CM | POA: Diagnosis not present

## 2017-04-02 DIAGNOSIS — Z885 Allergy status to narcotic agent status: Secondary | ICD-10-CM | POA: Diagnosis not present

## 2017-04-02 DIAGNOSIS — E079 Disorder of thyroid, unspecified: Secondary | ICD-10-CM | POA: Diagnosis not present

## 2017-04-02 DIAGNOSIS — E785 Hyperlipidemia, unspecified: Secondary | ICD-10-CM | POA: Diagnosis not present

## 2017-04-04 DIAGNOSIS — J45909 Unspecified asthma, uncomplicated: Secondary | ICD-10-CM | POA: Diagnosis not present

## 2017-04-30 DIAGNOSIS — J069 Acute upper respiratory infection, unspecified: Secondary | ICD-10-CM | POA: Diagnosis not present

## 2017-06-07 DIAGNOSIS — J45909 Unspecified asthma, uncomplicated: Secondary | ICD-10-CM | POA: Diagnosis not present

## 2017-06-11 ENCOUNTER — Ambulatory Visit
Admission: RE | Admit: 2017-06-11 | Discharge: 2017-06-11 | Disposition: A | Discharge status: Home / Self Care | Payer: BC Managed Care – PPO | Source: Ambulatory Visit | Attending: Family Medicine | Admitting: Family Medicine

## 2017-06-11 ENCOUNTER — Other Ambulatory Visit: Payer: Self-pay | Admitting: Family Medicine

## 2017-06-11 DIAGNOSIS — J209 Acute bronchitis, unspecified: Secondary | ICD-10-CM | POA: Diagnosis not present

## 2017-06-11 DIAGNOSIS — R059 Cough, unspecified: Secondary | ICD-10-CM

## 2017-06-11 DIAGNOSIS — J101 Influenza due to other identified influenza virus with other respiratory manifestations: Secondary | ICD-10-CM | POA: Diagnosis not present

## 2017-06-11 DIAGNOSIS — R05 Cough: Secondary | ICD-10-CM

## 2017-07-18 DIAGNOSIS — E669 Obesity, unspecified: Secondary | ICD-10-CM | POA: Diagnosis not present

## 2017-07-18 DIAGNOSIS — R7301 Impaired fasting glucose: Secondary | ICD-10-CM | POA: Diagnosis not present

## 2017-07-18 DIAGNOSIS — E039 Hypothyroidism, unspecified: Secondary | ICD-10-CM | POA: Diagnosis not present

## 2017-07-18 DIAGNOSIS — J45909 Unspecified asthma, uncomplicated: Secondary | ICD-10-CM | POA: Diagnosis not present

## 2017-07-18 DIAGNOSIS — H02409 Unspecified ptosis of unspecified eyelid: Secondary | ICD-10-CM | POA: Diagnosis not present

## 2017-07-18 DIAGNOSIS — E782 Mixed hyperlipidemia: Secondary | ICD-10-CM | POA: Diagnosis not present

## 2018-01-23 ENCOUNTER — Other Ambulatory Visit: Payer: Self-pay | Admitting: Family Medicine

## 2018-01-23 DIAGNOSIS — R7301 Impaired fasting glucose: Secondary | ICD-10-CM | POA: Diagnosis not present

## 2018-01-23 DIAGNOSIS — R229 Localized swelling, mass and lump, unspecified: Secondary | ICD-10-CM | POA: Diagnosis not present

## 2018-01-23 DIAGNOSIS — M1990 Unspecified osteoarthritis, unspecified site: Secondary | ICD-10-CM | POA: Diagnosis not present

## 2018-01-23 DIAGNOSIS — N393 Stress incontinence (female) (male): Secondary | ICD-10-CM | POA: Diagnosis not present

## 2018-01-23 DIAGNOSIS — E782 Mixed hyperlipidemia: Secondary | ICD-10-CM | POA: Diagnosis not present

## 2018-01-23 DIAGNOSIS — E039 Hypothyroidism, unspecified: Secondary | ICD-10-CM | POA: Diagnosis not present

## 2018-01-23 DIAGNOSIS — N3281 Overactive bladder: Secondary | ICD-10-CM | POA: Diagnosis not present

## 2018-01-23 DIAGNOSIS — H029 Unspecified disorder of eyelid: Secondary | ICD-10-CM | POA: Diagnosis not present

## 2018-01-23 DIAGNOSIS — Z23 Encounter for immunization: Secondary | ICD-10-CM | POA: Diagnosis not present

## 2018-01-23 DIAGNOSIS — Z0000 Encounter for general adult medical examination without abnormal findings: Secondary | ICD-10-CM | POA: Diagnosis not present

## 2018-01-23 DIAGNOSIS — J45909 Unspecified asthma, uncomplicated: Secondary | ICD-10-CM | POA: Diagnosis not present

## 2018-01-31 ENCOUNTER — Ambulatory Visit
Admission: RE | Admit: 2018-01-31 | Discharge: 2018-01-31 | Disposition: A | Discharge status: Home / Self Care | Payer: BC Managed Care – PPO | Source: Ambulatory Visit | Attending: Family Medicine | Admitting: Family Medicine

## 2018-01-31 DIAGNOSIS — R229 Localized swelling, mass and lump, unspecified: Secondary | ICD-10-CM

## 2018-02-25 DIAGNOSIS — R05 Cough: Secondary | ICD-10-CM | POA: Diagnosis not present

## 2018-02-25 DIAGNOSIS — J069 Acute upper respiratory infection, unspecified: Secondary | ICD-10-CM | POA: Diagnosis not present

## 2018-04-04 DIAGNOSIS — H25012 Cortical age-related cataract, left eye: Secondary | ICD-10-CM | POA: Diagnosis not present

## 2018-04-04 DIAGNOSIS — H52203 Unspecified astigmatism, bilateral: Secondary | ICD-10-CM | POA: Diagnosis not present

## 2018-04-04 DIAGNOSIS — H2513 Age-related nuclear cataract, bilateral: Secondary | ICD-10-CM | POA: Diagnosis not present

## 2018-04-04 DIAGNOSIS — H524 Presbyopia: Secondary | ICD-10-CM | POA: Diagnosis not present

## 2018-04-04 DIAGNOSIS — H5213 Myopia, bilateral: Secondary | ICD-10-CM | POA: Diagnosis not present

## 2018-04-10 DIAGNOSIS — H2511 Age-related nuclear cataract, right eye: Secondary | ICD-10-CM | POA: Diagnosis not present

## 2018-04-10 DIAGNOSIS — H25012 Cortical age-related cataract, left eye: Secondary | ICD-10-CM | POA: Diagnosis not present

## 2018-04-10 DIAGNOSIS — H2512 Age-related nuclear cataract, left eye: Secondary | ICD-10-CM | POA: Diagnosis not present

## 2018-04-17 DIAGNOSIS — H2511 Age-related nuclear cataract, right eye: Secondary | ICD-10-CM | POA: Diagnosis not present

## 2018-05-17 DIAGNOSIS — R509 Fever, unspecified: Secondary | ICD-10-CM | POA: Diagnosis not present

## 2018-05-17 DIAGNOSIS — J069 Acute upper respiratory infection, unspecified: Secondary | ICD-10-CM | POA: Diagnosis not present

## 2018-05-17 DIAGNOSIS — R062 Wheezing: Secondary | ICD-10-CM | POA: Diagnosis not present

## 2018-07-30 DIAGNOSIS — Z6836 Body mass index (BMI) 36.0-36.9, adult: Secondary | ICD-10-CM | POA: Diagnosis not present

## 2018-07-30 DIAGNOSIS — J45909 Unspecified asthma, uncomplicated: Secondary | ICD-10-CM | POA: Diagnosis not present

## 2018-07-30 DIAGNOSIS — E039 Hypothyroidism, unspecified: Secondary | ICD-10-CM | POA: Diagnosis not present

## 2018-07-30 DIAGNOSIS — E782 Mixed hyperlipidemia: Secondary | ICD-10-CM | POA: Diagnosis not present

## 2018-07-30 DIAGNOSIS — R7301 Impaired fasting glucose: Secondary | ICD-10-CM | POA: Diagnosis not present

## 2018-11-11 DIAGNOSIS — L821 Other seborrheic keratosis: Secondary | ICD-10-CM | POA: Diagnosis not present

## 2018-11-11 DIAGNOSIS — L258 Unspecified contact dermatitis due to other agents: Secondary | ICD-10-CM | POA: Diagnosis not present

## 2019-02-07 ENCOUNTER — Other Ambulatory Visit: Payer: Self-pay | Admitting: Family Medicine

## 2019-02-07 ENCOUNTER — Ambulatory Visit
Admission: RE | Admit: 2019-02-07 | Discharge: 2019-02-07 | Disposition: A | Discharge status: Home / Self Care | Payer: BC Managed Care – PPO | Source: Ambulatory Visit | Attending: Family Medicine | Admitting: Family Medicine

## 2019-02-07 DIAGNOSIS — R0600 Dyspnea, unspecified: Secondary | ICD-10-CM

## 2019-03-05 DIAGNOSIS — L0291 Cutaneous abscess, unspecified: Secondary | ICD-10-CM | POA: Diagnosis not present

## 2019-04-09 ENCOUNTER — Encounter: Payer: Self-pay | Admitting: Internal Medicine

## 2019-04-09 ENCOUNTER — Other Ambulatory Visit: Payer: Self-pay

## 2019-04-09 ENCOUNTER — Ambulatory Visit (INDEPENDENT_AMBULATORY_CARE_PROVIDER_SITE_OTHER): Payer: BC Managed Care – PPO | Admitting: Internal Medicine

## 2019-04-09 VITALS — BP 130/90 | HR 76 | Temp 98.0°F | Ht 62.0 in | Wt 199.4 lb

## 2019-04-09 DIAGNOSIS — R0609 Other forms of dyspnea: Secondary | ICD-10-CM

## 2019-04-09 DIAGNOSIS — R0600 Dyspnea, unspecified: Secondary | ICD-10-CM

## 2019-04-09 MED ORDER — ALBUTEROL SULFATE HFA 108 (90 BASE) MCG/ACT IN AERS: 2.0000 | INHALATION_SPRAY | Freq: Four times a day (QID) | RESPIRATORY_TRACT | 5 refills | Status: AC | PRN

## 2019-04-09 MED ORDER — SYMBICORT 160-4.5 MCG/ACT IN AERO: 2.0000 | INHALATION_SPRAY | Freq: Two times a day (BID) | RESPIRATORY_TRACT | 5 refills | Status: DC

## 2019-04-09 NOTE — Addendum Note (Signed)
Addended by: Durel Salts on: 04/09/2019 11:26 AM   Modules accepted: Orders

## 2019-04-09 NOTE — Progress Notes (Signed)
Adriana Oliver    [id number]    05/02/1950  Primary Care Physician:Shaw, Nathen May, MD  Referring Physician: Mayra Neer, MD [place] [address] [employer] [address] [place],  [place] [address] Reason for Consultation: "asthma" Date of Consultation: 04/09/2019  Chief complaint:   Chief Complaint  Patient presents with  . Consult    Patient is here for COPD. Patient has shortness of breath with exertion.      HPI:  Has shortness of breath which started  In 2013. She saw Dr. Melvyn Novas in 2013 and was told she has COPD. She notes it has been about 5 years since she was last able to be as active as she wanted to be. Daily activities such as laundry and groceries Spring time is worse and early fall. Was given breathing treatments and steroids back in 2013 She doesn't like the taste of inhalers or the side effects of albuterol so she doesn't take these regularly. She does have a nebulizer machine at home only uses it when she absolutely needs it, last time in October.  When she saw Dr. Melvyn Novas back in 2013, she was treated with prednisone taper, as well as a trial of inhalers.  She does not remember any of this and cannot tell me if she felt good on them.  Current Regimen: Symbicort as needed, albuterol as needed Asthma Triggers: Dust, seasonal allergies, outdoor allergens. Exacerbations in the last year: History of hospitalization or intubation: none Hives: no Allergy Testing: no GERD: Allergic Rhinitis: she does have seasonal allergies FeNO: none  Social history:  Occupation: Retired Radio producer. Exposures: Lives at home with husband - non smoker - dog Smoking history: has passive smoke exposure in childhood.   Social History   Occupational History  . Occupation: Pharmacist, hospital  Tobacco Use  . Smoking status: Never Smoker  . Smokeless tobacco: Never Used  Substance and Sexual Activity  . Alcohol use: No  . Drug use: No  . Sexual activity: Not on file    Relevant family  history:  Family History  Problem Relation Age of Onset  . Emphysema Mother        deceased  . COPD Mother   . Diabetes Sister   . Arthritis Sister   . Diabetes Sister     Past Medical History:  Diagnosis Date  . COPD (chronic obstructive pulmonary disease) ([place])   . DOE (dyspnea on exertion)   . Hyperlipidemia   . Hypothyroidism   . Incontinence   . Obesity   . Osteopenia   . Prediabetes     Past Surgical History:  Procedure Laterality Date  . APPENDECTOMY    . RHINOPLASTY       Review of systems: Review of Systems  Constitutional: Negative for chills and fever.  HENT: Positive for congestion, sinus pain and sore throat.   Eyes: Negative for discharge and redness.  Respiratory: Positive for cough, shortness of breath and wheezing. Negative for hemoptysis.   Cardiovascular: Negative for chest pain and palpitations.  Gastrointestinal: Positive for heartburn. Negative for nausea and vomiting.  Genitourinary: Negative.   Musculoskeletal: Negative for myalgias.  Skin: Negative for rash.  Neurological: Negative for focal weakness.  Endo/Heme/Allergies: Positive for environmental allergies.  Psychiatric/Behavioral: The patient is not nervous/anxious.   All other systems reviewed and are negative.   Physical Exam: Blood pressure 130/90, pulse 76, temperature 98 F (36.7 C), temperature source Temporal, height 5\' 2"  (1.575 m), weight 199 lb 6.4 oz (90.4  kg), SpO2 93 %. Gen:      No acute distress Eyes: EOMI, sclera anicteric ENT:  no nasal polyps, mucus membranes moist, no nasal debris Neck:     Supple, no thyromegaly Lungs:    No increased respiratory effort, symmetric chest wall excursion, clear to auscultation bilaterally, no wheezes or crackles CV:         Regular rate and rhythm; no murmurs, rubs, or gallops.  No pedal edema Abd:      + bowel sounds; soft, non-tender; no distension MSK: no acute synovitis of DIP or PIP joints, no mechanics hands.  Skin:       Warm and dry; no rashes Neuro: normal speech, no focal facial asymmetry Psych: alert and oriented x3, normal mood and affect  Data Reviewed: Imaging: I have personally reviewed the chest xray nov 2020 which shows no acute cardiopulmonary process.  PFTs: No PFTs on file.   Immunization status: Immunization History  Administered Date(s) Administered  . Fluad Quad(high Dose 65+) 01/07/2019  . Influenza Split 03/02/2012  . Influenza, High Dose Seasonal PF 01/05/2015  . Pneumococcal Conjugate-13 01/05/2015  . Tdap 06/03/2010  . Zoster 08/18/2011   Assessment:  Moderate persistent asthma Allergic rhinitis, seasonal  Plan/Recommendations: The patient does not have COPD, she is a never smoker. She was started on Symbicort at some point and has been taking it as needed.  We will increase this to twice a day 2 puffs each time, and also start a as needed albuterol for her to take for episodes of chest tightness and wheezing.  I refilled both of these today.  We also went over inhaler teaching with a spacer.    I spent 47 minutes on 04/09/2019 in care of this patient including face to face time and non-face to face time spent charting, review of outside records, and coordination of care.   Return to Care: Return in about 3 months (around 07/08/2019).  Durel Salts, MD Pulmonary and Critical Care Medicine [facility] Office:[phone]  CC: Lupita Raider, MD

## 2019-04-09 NOTE — Patient Instructions (Addendum)
The patient should have follow up scheduled with myself in 3 months.   Start taking symbicort 2 puffs in the morning, 2 puffs at night and you need to gargle after taking it.   The albuterol is the rescue inhaler which you take as needed.   ------------------------------------------------------------------------------------------------------------------- Metered Dose Inhaler (MDI) Instructions (Albuterol, symbicort)  Before using your inhaler for the first time: 1. Take the cap off the mouthpiece. 2. Shake the inhaler for 5 seconds 3. Press down on the canister to spray the medicine into the air. 4. Repeat these steps 3 more times. If you haven't used your inhaler in more than 2 weeks, repeat these steps before using it.  To use your inhaler: 1. Take the cap off the mouthpiece 2. Shake the inhaler for 5 seconds. 3. Hold it upright with your finger on the top of the canister and your thumb on the bottom of the inhaler. 4. Breathe out. 5. Close your lips around the mouthpiece. 6. As you start to inhale the next breath, press down on the canister. 7. Inhale deeply and slowly through your mouth. 8. Hold your breath for 5 to 10 seconds to keep the medicine in your lungs. 9. Let your breath out.   10. Repeat these steps if you are supposed to take 2 puffs.  11. Put the cap back on the mouthpiece 12. Remember to rinse, gargle and spit with water after use if your inhaler has a steroid in it (Advair, Symbicort, Dulera, Qvar, Flovent)  Caring for your MDI and chamber For most MDIs, remove the canister and rinse the plastic holder with warm running water once a week to prevent the holes from getting clogged. Shake well and let air dry. There are some medications in which the inhaler cannot be removed from the holder. These usually need to be cleaned by wiping the mouthpiece with a cloth or cleaning with a dry cotton swab. Refer to the patient instructions that come with your inhaler. Clean the  chamber about once a week. Remove the soft ring at the end of the chamber. Soak the spacer in warm water with a mild detergent. Carefully clean and, rinse, and shake off excess water. Do not hand dry. Allow to completely air dry. Do not store the chamber in a plastic bag.  Checking your MDI It is important that you know how much medication is left in your inhaler. The number of puffs contained in your MDI is printed on the side of the canister. After you have used that number of puffs, you must discard your inhaler even if it continues to spray. Keep track of how many puffs you have used. You also must include priming puffs in this total. If you use an MDI every day for control of symptoms, you can determine how long it will last by dividing the total number of puffs in the MDI by the total puffs you use every day. For example: 2 puffs x 2 times per day = 4 total puffs per day. At 120 puffs, the MDI will last 30 days. If you use an inhaler only when you need to, you must keep track of how many times you spray the inhaler. Some of the newer MDIs have counting devices built in.  If your MDI does not have a dose counter, you can obtain a device that attaches to the MDI and counts down the number of puffs each time you press the inhaler. Ask your health care professional for more information  about these devices, as well as how to best keep track of your medicine without an add-on device (if you prefer).     By learning about asthma and how it can be controlled, you take an important step toward managing this disease. Work closely with your asthma care team to learn all you can about your asthma, how to avoid triggers, what your medications do, and how to take them correctly. With proper care, you can live free of asthma symptoms and maintain a normal, healthy lifestyle.   What is asthma? Asthma is a chronic disease that affects the airways of the lungs. During normal breathing, the bands of muscle that  surround the airways are relaxed and air moves freely. During an asthma episode or "attack," there are three main changes that stop air from moving easily through the airways:  The bands of muscle that surround the airways tighten and make the airways narrow. This tightening is called bronchospasm.   The lining of the airways becomes swollen or inflamed.   The cells that line the airways produce more mucus, which is thicker than normal and clogs the airways.  These three factors - bronchospasm, inflammation, and mucus production - cause symptoms such as difficulty breathing, wheezing, and coughing.  What are the most common symptoms of asthma? Asthma symptoms are not the same for everyone. They can even change from episode to episode in the same person. Also, you may have only one symptom of asthma, such as cough, but another person may have all the symptoms of asthma. It is important to know all the symptoms of asthma and to be aware that your asthma can present in any of these ways at any time. The most common symptoms include: . Coughing, especially at night  . Shortness of breath  . Wheezing  . Chest tightness, pain, or pressure   Who is affected by asthma? Asthma affects 22 million Americans; about 6 million of these are children under age 58. People who have a family history of asthma have an increased risk of developing the disease. Asthma is also more common in people who have allergies or who are exposed to tobacco smoke. However, anyone can develop asthma at any time. Some people may have asthma all of their lives, while others may develop it as adults.  What causes asthma? The airways in a person with asthma are very sensitive and react to many things, or "triggers." Contact with these triggers causes asthma symptoms. One of the most important parts of asthma control is to identify your triggers and then avoid them when possible. The only trigger you do not want to avoid is exercise.  Pre-treatment with medicines before exercise can allow you to stay active yet avoid asthma symptoms. Common asthma triggers include: 1. Infections (colds, viruses, flu, sinus infections)  2. Exercise  3. Weather (changes in temperature and/or humidity, cold air)  4. Tobacco smoke  5. Allergens (dust mites, pollens, pets, mold spores, cockroaches, and sometimes foods)  6. Irritants (strong odors from cleaning products, perfume, wood smoke, air pollution)  7. Strong emotions such as crying or laughing hard  8. Some medications   How is asthma diagnosed? To diagnose asthma, your doctor will first review your medical history, family history, and symptoms. Your doctor will want to know any past history of breathing problems you may have had, as well as a family history of asthma, allergies, eczema (a bumpy, itchy skin rash caused by allergies), or other lung disease. It  is important that you describe your symptoms in detail (cough, wheeze, shortness of breath, chest tightness), including when and how often they occur. The doctor will perform a physical examination and listen to your heart and lungs. He or she may also order breathing tests, allergy tests, blood tests, and chest and sinus X-rays. The tests will find out if you do have asthma and if there are any other conditions that are contributing factors.  How is asthma treated? Asthma can be controlled, but not cured. It is not normal to have frequent symptoms, trouble sleeping, or trouble completing tasks. Appropriate asthma care will prevent symptoms and visits to the emergency room and hospital. Asthma medicines are one of the mainstays of asthma treatment. The drugs used to treat asthma are explained below.  Anti-inflammatories: These are the most important drugs for most people with asthma. Anti-inflammatory drugs reduce swelling and mucus production in the airways. As a result, airways are less sensitive and less likely to react to triggers.  These medications need to be taken daily and may need to be taken for several weeks before they begin to control asthma. Anti-inflammatory medicines lead to fewer symptoms, better airflow, less sensitive airways, less airway damage, and fewer asthma attacks. If taken every day, they CONTROL or prevent asthma symptoms.   Bronchodilators: These drugs relax the muscle bands that tighten around the airways. This action opens the airways, letting more air in and out of the lungs and improving breathing. Bronchodilators also help clear mucus from the lungs. As the airways open, the mucus moves more freely and can be coughed out more easily. In short-acting forms, bronchodilators RELIEVE or stop asthma symptoms by quickly opening the airways and are very helpful during an asthma episode. In long-acting forms, bronchodilators provide CONTROL of asthma symptoms and prevent asthma episodes.  Asthma drugs can be taken in a variety of ways. Inhaling the medications by using a metered dose inhaler, dry powder inhaler, or nebulizer is one way of taking asthma medicines. Oral medicines (pills or liquids you swallow) may also be prescribed.  Asthma severity Asthma is classified as either "intermittent" (comes and goes) or "persistent" (lasting). Persistent asthma is further described as being mild, moderate, or severe. The severity of asthma is based on how often you have symptoms both during the day and night, as well as by the results of lung function tests and by how well you can perform activities. The "severity" of asthma refers to how "intense" or "strong" your asthma is.  Asthma control Asthma control is the goal of asthma treatment. Regardless of your asthma severity, it may or may not be controlled. Asthma control means: . You are able to do everything you want to do at work and home  . You have no (or minimal) asthma symptoms  . You do not wake up from your sleep or earlier than usual in the morning due to  asthma  . You rarely need to use your reliever medicine (inhaler)  Another major part of your treatment is that you are happy with your asthma care and believe your asthma is controlled.  Monitoring symptoms A key part of treatment is keeping track of how well your lungs are working. Monitoring your symptoms what they are, how and when they happen, and how severe they are  is an important part of being able to control your asthma.  Sometimes asthma is monitored using a peak flow meter. A peak flow (PF) meter measures how fast the air  comes out of your lungs. It can help you know when your asthma is getting worse, sometimes even before you have symptoms. By taking daily peak flow readings, you can learn when to adjust medications to keep asthma under good control. It is also used to create your asthma action plan (see below). Your doctor can use your peak flow readings to adjust your treatment plan in some cases.  Asthma Action Plan Based on your history and asthma severity, you and your doctor will develop a care plan called an "asthma action plan." The asthma action plan describes when and how to use your medicines, actions to take when asthma worsens, and when to seek emergency care. Make sure you understand this plan. If you do not, ask your asthma care provider any questions you may have. Your asthma action plan is one of the keys to controlling asthma. Keep it readily available to remind you of what you need to do every day to control asthma and what you need to do when symptoms occur.  Goals of asthma therapy These are the goals of asthma treatment: . Live an active, normal life  . Prevent chronic and troublesome symptoms  . Attend work or school every day  . Perform daily activities without difficulty  . Stop urgent visits to the doctor, emergency department, or hospital  . Use and adjust medications to control asthma with few or no side effects

## 2019-08-05 DIAGNOSIS — E669 Obesity, unspecified: Secondary | ICD-10-CM | POA: Diagnosis not present

## 2019-08-05 DIAGNOSIS — E039 Hypothyroidism, unspecified: Secondary | ICD-10-CM | POA: Diagnosis not present

## 2019-08-05 DIAGNOSIS — G4700 Insomnia, unspecified: Secondary | ICD-10-CM | POA: Diagnosis not present

## 2019-08-05 DIAGNOSIS — F411 Generalized anxiety disorder: Secondary | ICD-10-CM | POA: Diagnosis not present

## 2019-08-05 DIAGNOSIS — K589 Irritable bowel syndrome without diarrhea: Secondary | ICD-10-CM | POA: Diagnosis not present

## 2019-08-05 DIAGNOSIS — J45909 Unspecified asthma, uncomplicated: Secondary | ICD-10-CM | POA: Diagnosis not present

## 2019-08-05 DIAGNOSIS — E782 Mixed hyperlipidemia: Secondary | ICD-10-CM | POA: Diagnosis not present

## 2019-09-18 IMAGING — US US EXTREM LOW*R* LIMITED
1 series · 13 of 13 positions shown · non-contrast
Comparison: None.

CLINICAL DATA: Upper RIGHT posterior thigh skin mass.

EXAM:
ULTRASOUND RIGHT LOWER EXTREMITY LIMITED
TECHNIQUE: Ultrasound examination of the lower extremity soft tissues was
performed in the area of clinical concern.

[Series 1: us extrem low*right* limited · 0.06mm/px · 13 of 13 slices shown]
[im 1/13]
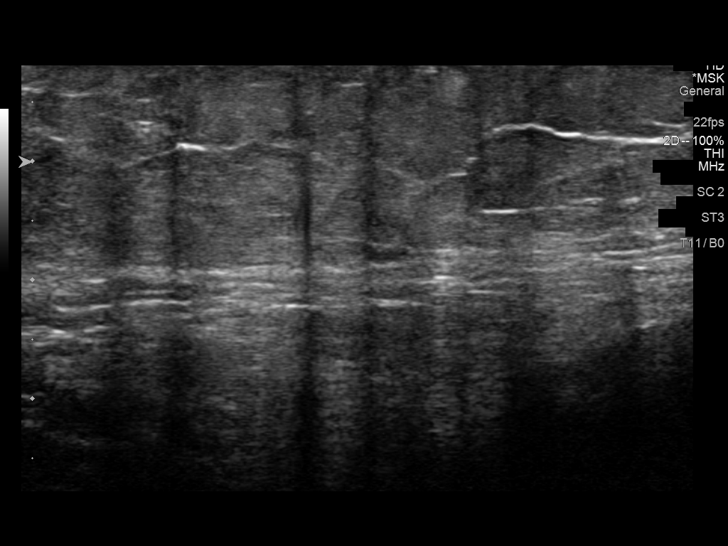
[im 2/13]
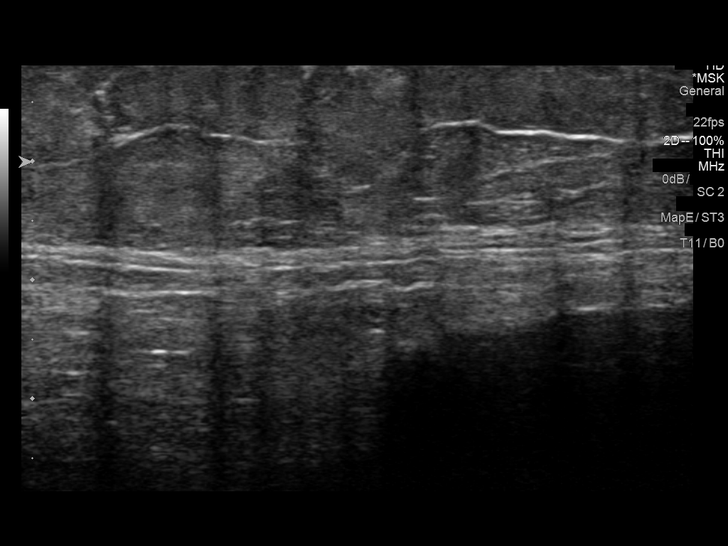
[im 3/13]
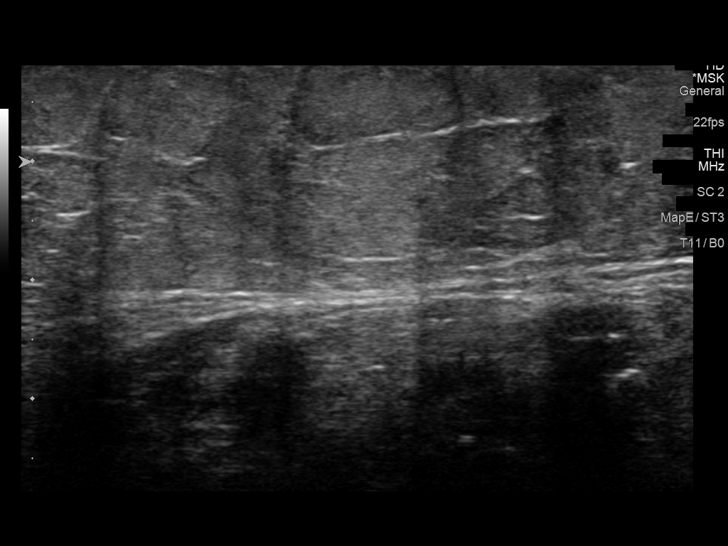
[im 4/13]
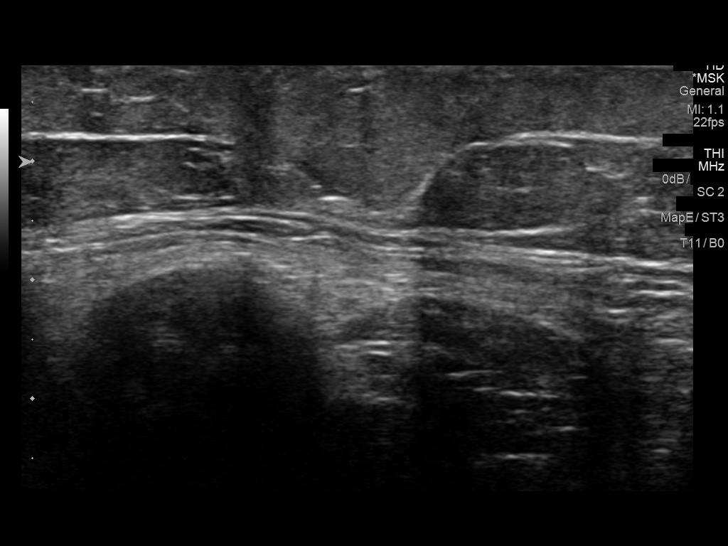
[im 5/13]
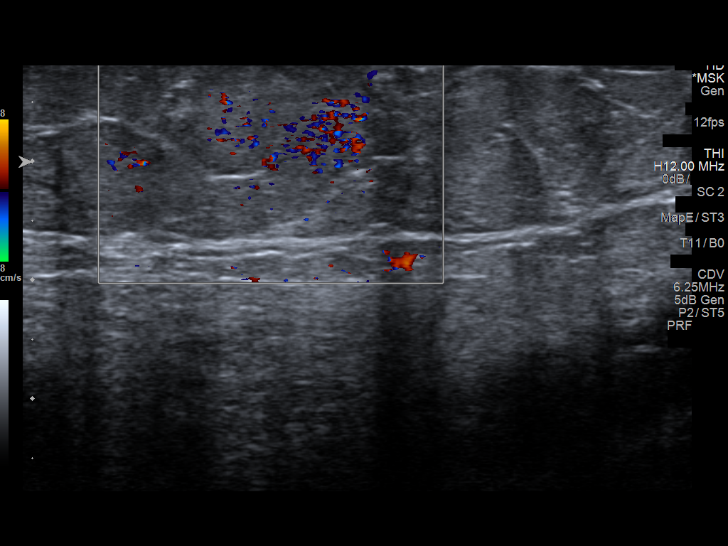
[im 6/13]
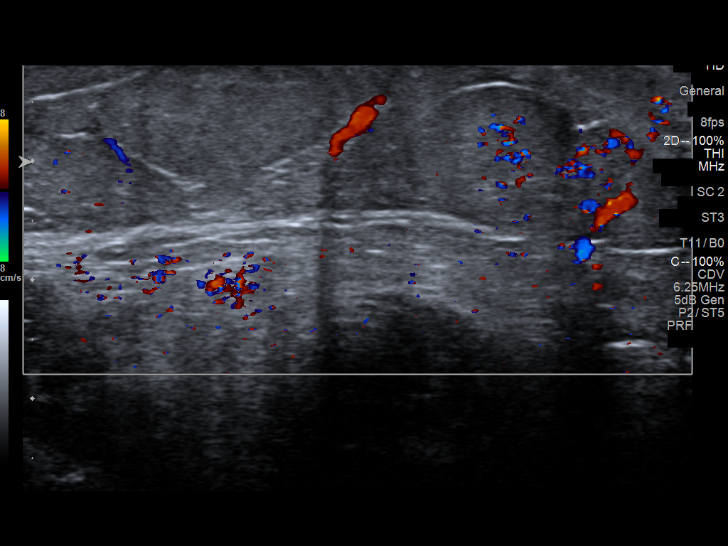
[im 7/13]
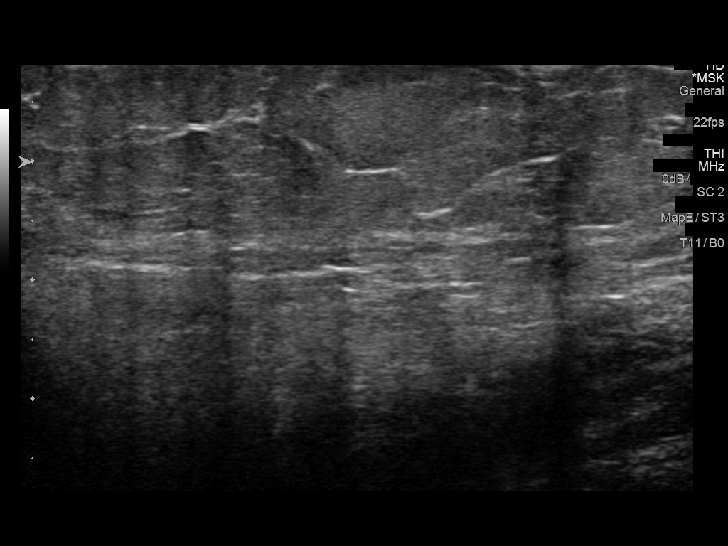
[im 8/13]
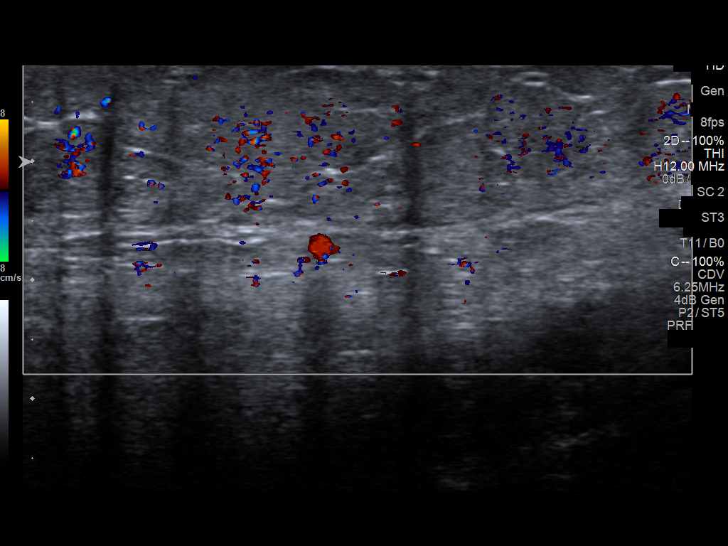
[im 9/13]
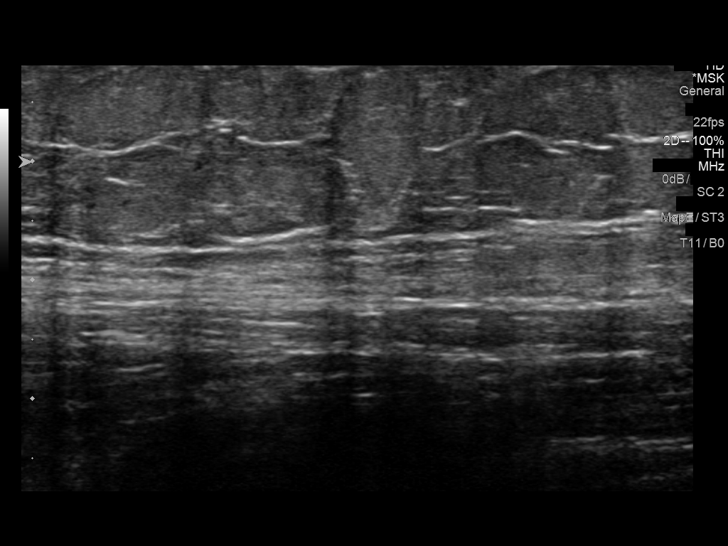
[im 10/13]
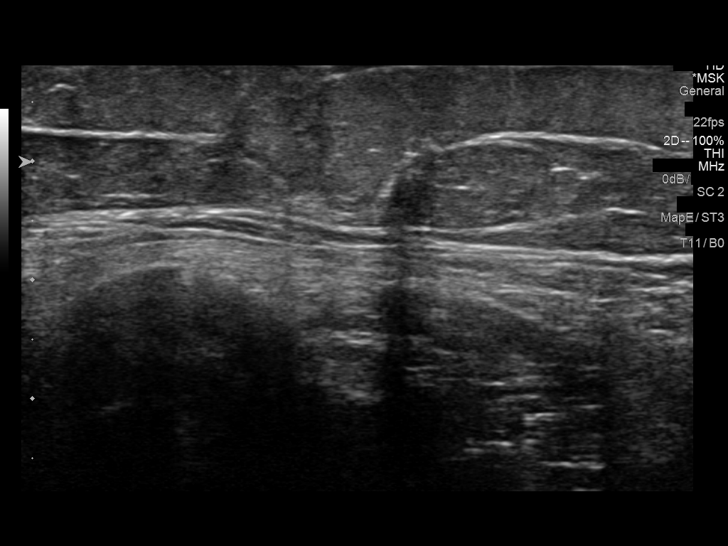
[im 11/13]
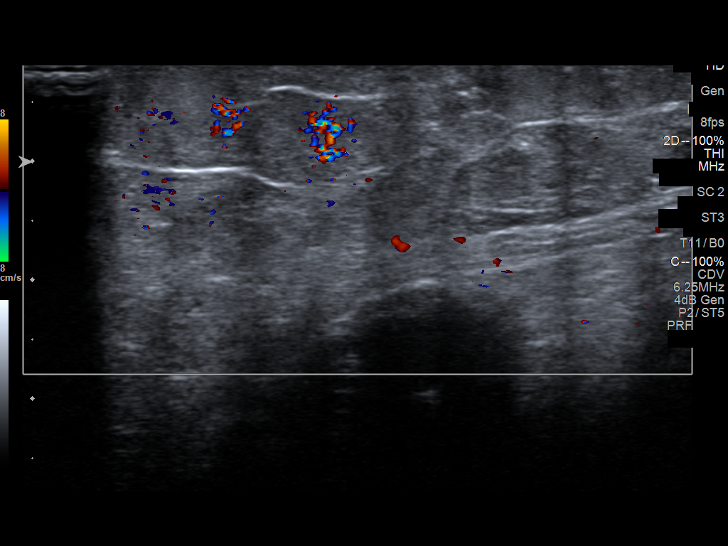
[im 12/13]
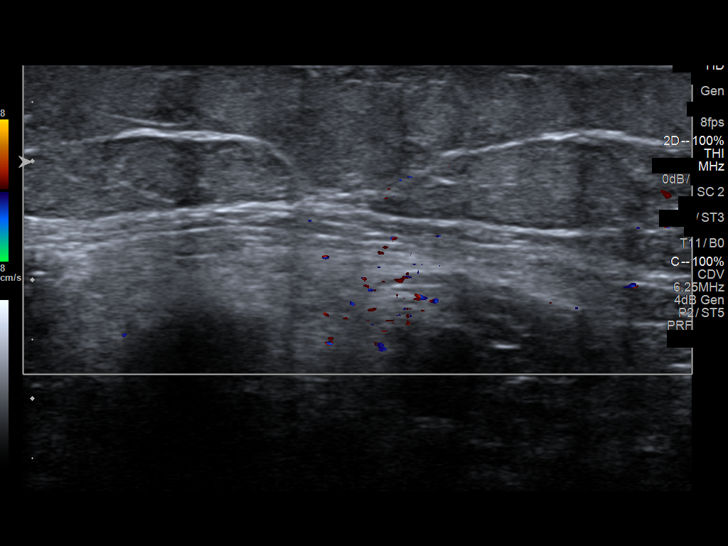
[im 13/13]
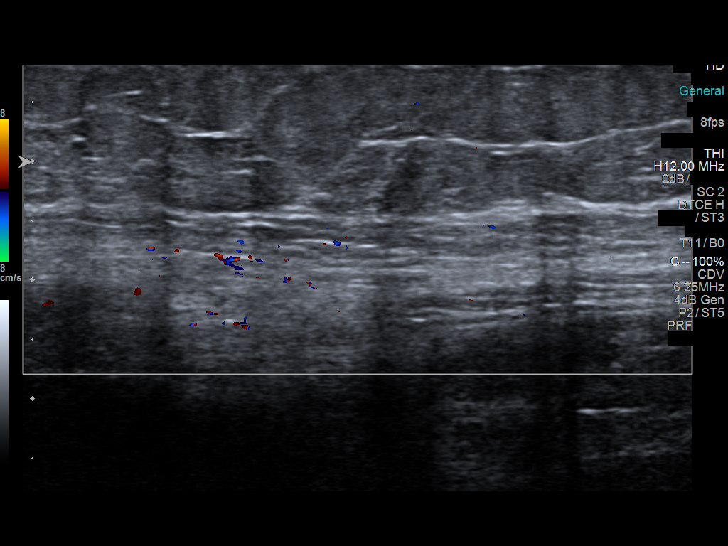

[13 of 13 positions shown; findings below may reference images not displayed]

FINDINGS: Ultrasound performed of the palpable area of concern in the upper
posterior-lateral RIGHT thigh, as directed by the patient, showing
only normal soft tissues throughout. No mass or fluid collection. No
skin thickening or evidence of soft tissue edema.
IMPRESSION: Normal ultrasound evaluation of the area of concern in the upper
posterior-lateral RIGHT thigh. If clinical findings persist or
worsen, consider MRI for more definitive characterization.

## 2019-12-16 DIAGNOSIS — B029 Zoster without complications: Secondary | ICD-10-CM | POA: Diagnosis not present

## 2020-01-02 DIAGNOSIS — Z23 Encounter for immunization: Secondary | ICD-10-CM | POA: Diagnosis not present

## 2020-02-18 DIAGNOSIS — R7301 Impaired fasting glucose: Secondary | ICD-10-CM | POA: Diagnosis not present

## 2020-02-18 DIAGNOSIS — E039 Hypothyroidism, unspecified: Secondary | ICD-10-CM | POA: Diagnosis not present

## 2020-02-18 DIAGNOSIS — N393 Stress incontinence (female) (male): Secondary | ICD-10-CM | POA: Diagnosis not present

## 2020-02-18 DIAGNOSIS — M1990 Unspecified osteoarthritis, unspecified site: Secondary | ICD-10-CM | POA: Diagnosis not present

## 2020-02-18 DIAGNOSIS — S90229A Contusion of unspecified lesser toe(s) with damage to nail, initial encounter: Secondary | ICD-10-CM | POA: Diagnosis not present

## 2020-02-18 DIAGNOSIS — J45909 Unspecified asthma, uncomplicated: Secondary | ICD-10-CM | POA: Diagnosis not present

## 2020-02-18 DIAGNOSIS — Z0000 Encounter for general adult medical examination without abnormal findings: Secondary | ICD-10-CM | POA: Diagnosis not present

## 2020-02-18 DIAGNOSIS — E782 Mixed hyperlipidemia: Secondary | ICD-10-CM | POA: Diagnosis not present

## 2020-02-18 DIAGNOSIS — M899 Disorder of bone, unspecified: Secondary | ICD-10-CM | POA: Diagnosis not present

## 2020-02-18 DIAGNOSIS — N3281 Overactive bladder: Secondary | ICD-10-CM | POA: Diagnosis not present

## 2020-08-19 DIAGNOSIS — E669 Obesity, unspecified: Secondary | ICD-10-CM | POA: Diagnosis not present

## 2020-08-19 DIAGNOSIS — R7301 Impaired fasting glucose: Secondary | ICD-10-CM | POA: Diagnosis not present

## 2020-08-19 DIAGNOSIS — K117 Disturbances of salivary secretion: Secondary | ICD-10-CM | POA: Diagnosis not present

## 2020-08-19 DIAGNOSIS — H02402 Unspecified ptosis of left eyelid: Secondary | ICD-10-CM | POA: Diagnosis not present

## 2020-08-19 DIAGNOSIS — E039 Hypothyroidism, unspecified: Secondary | ICD-10-CM | POA: Diagnosis not present

## 2020-08-19 DIAGNOSIS — M25519 Pain in unspecified shoulder: Secondary | ICD-10-CM | POA: Diagnosis not present

## 2020-08-19 DIAGNOSIS — J45909 Unspecified asthma, uncomplicated: Secondary | ICD-10-CM | POA: Diagnosis not present

## 2020-08-19 DIAGNOSIS — E782 Mixed hyperlipidemia: Secondary | ICD-10-CM | POA: Diagnosis not present

## 2020-08-23 DIAGNOSIS — M25512 Pain in left shoulder: Secondary | ICD-10-CM | POA: Diagnosis not present

## 2020-08-23 DIAGNOSIS — M25511 Pain in right shoulder: Secondary | ICD-10-CM | POA: Diagnosis not present

## 2020-09-23 DIAGNOSIS — M25512 Pain in left shoulder: Secondary | ICD-10-CM | POA: Diagnosis not present

## 2020-10-05 DIAGNOSIS — Z6833 Body mass index (BMI) 33.0-33.9, adult: Secondary | ICD-10-CM | POA: Diagnosis not present

## 2020-10-05 DIAGNOSIS — E669 Obesity, unspecified: Secondary | ICD-10-CM | POA: Diagnosis not present

## 2020-10-05 DIAGNOSIS — B349 Viral infection, unspecified: Secondary | ICD-10-CM | POA: Diagnosis not present

## 2020-10-05 DIAGNOSIS — U071 COVID-19: Secondary | ICD-10-CM | POA: Diagnosis not present

## 2020-11-05 ENCOUNTER — Other Ambulatory Visit: Payer: Self-pay

## 2020-11-05 ENCOUNTER — Ambulatory Visit (INDEPENDENT_AMBULATORY_CARE_PROVIDER_SITE_OTHER): Payer: Medicare Other | Admitting: Plastic Surgery

## 2020-11-05 ENCOUNTER — Encounter: Payer: Self-pay | Admitting: Plastic Surgery

## 2020-11-05 DIAGNOSIS — H02831 Dermatochalasis of right upper eyelid: Secondary | ICD-10-CM | POA: Diagnosis not present

## 2020-11-05 DIAGNOSIS — H02403 Unspecified ptosis of bilateral eyelids: Secondary | ICD-10-CM | POA: Diagnosis not present

## 2020-11-05 DIAGNOSIS — H02836 Dermatochalasis of left eye, unspecified eyelid: Secondary | ICD-10-CM | POA: Insufficient documentation

## 2020-11-05 DIAGNOSIS — H02834 Dermatochalasis of left upper eyelid: Secondary | ICD-10-CM

## 2020-11-05 DIAGNOSIS — H02833 Dermatochalasis of right eye, unspecified eyelid: Secondary | ICD-10-CM | POA: Insufficient documentation

## 2020-11-05 NOTE — Progress Notes (Addendum)
Patient ID: Adriana Oliver, female    DOB: 1951-02-01, 70 y.o.   MRN: [id number]   Chief Complaint  Patient presents with   Advice Only    The patient is a 70 year old female here for evaluation of her upper lids.  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  She was sent by Dr. Clelia Croft.  She has some gross left-sided facial weakness.  It does not appear to be a Bell's palsy.  Her primary care physician has excluded stroke as well.  It is most evident with her upper eyelid on the left.  She has both her brow and lid with ptosis.  There is improvement with taping of the brow.  The lid still lags quite a bit on the left.  She has a little bit of ptosis on the right side as well on the right upper lid.  She says that she is constantly tearing and this has created irritation of her cheeks with the rashes.  Her ophthalmologist is Dr. Nile Riggs.  She has not had a visual field exam yet.  She has a history of hypothyroidism, hyperlipidemia, asthma, abnormal fasting glucose and shoulder pain.  Her main complaint is her visual field that has been affected and drooling from the left side of her mouth.   Review of Systems  Constitutional:  Negative for activity change and appetite change.  HENT: Negative.    Eyes: Negative.   Respiratory:  Negative for chest tightness and shortness of breath.   Cardiovascular:  Negative for chest pain.  Gastrointestinal: Negative.   Endocrine: Negative.   Genitourinary: Negative.   Musculoskeletal: Negative.   Skin:  Positive for rash.  Hematological: Negative.   Psychiatric/Behavioral: Negative.     Past Medical History:  Diagnosis Date   COPD (chronic obstructive pulmonary disease) (HCC)    DOE (dyspnea on exertion)    Hyperlipidemia    Hypothyroidism    Incontinence    Obesity    Osteopenia    Prediabetes     Past Surgical History:  Procedure Laterality Date   APPENDECTOMY     RHINOPLASTY        Current Outpatient  Medications:    acetaminophen (TYLENOL) 650 MG CR tablet, Take 650 mg by mouth every 8 (eight) hours as needed., Disp: , Rfl:    albuterol (PROAIR HFA) 108 (90 Base) MCG/ACT inhaler, Inhale 2 puffs into the lungs 4 (four) times daily as needed for wheezing or shortness of breath., Disp: 18 g, Rfl: 5   benzonatate (TESSALON) 100 MG capsule, Take 100 mg by mouth 3 (three) times daily as needed. , Disp: , Rfl:    levothyroxine (SYNTHROID) 50 MCG tablet, Take 560 mcg by mouth daily., Disp: , Rfl:    omeprazole (PRILOSEC) 20 MG capsule, before first and last meals daily, Disp: 60 capsule, Rfl: 11   SYMBICORT 160-4.5 MCG/ACT inhaler, Inhale 2 puffs into the lungs 2 (two) times daily., Disp: 1 Inhaler, Rfl: 5   Objective:   Vitals:   11/05/20 0832  BP: (!) 141/71  Pulse: 72  SpO2: 95%    Physical Exam Vitals and nursing note reviewed.  Constitutional:      Appearance: Normal appearance.  HENT:     Head: Normocephalic.  Eyes:     Extraocular Movements: Extraocular movements intact.     Conjunctiva/sclera: Conjunctivae normal.  Cardiovascular:     Rate and Rhythm: Normal rate.     Pulses: Normal pulses.  Pulmonary:     Effort: Pulmonary effort is normal. No respiratory distress.  Abdominal:     General: Abdomen is flat. There is no distension.     Tenderness: There is no abdominal tenderness.  Musculoskeletal:        General: No swelling or deformity.  Skin:    General: Skin is warm.     Capillary Refill: Capillary refill takes less than 2 seconds.     Coloration: Skin is not jaundiced or pale.     Findings: Rash present.  Neurological:     Mental Status: She is alert and oriented to person, place, and time.  Psychiatric:        Mood and Affect: Mood normal.        Behavior: Behavior normal.        Thought Content: Thought content normal.    Assessment & Plan:  Dermatochalasis of both upper eyelids  Acquired ptosis of eyelid, bilateral  I have referred the patient for a  computerized visual field exam.  I would like to see her back after that.  This will likely be a patient for Dr. Beaulah Dinning given his subspecialty with oculoplastics.  I have shared that with the patient and she is in agreement. Pictures were obtained of the patient and placed in the chart with the patient's or guardian's permission.  Alena Bills Archita Lomeli, DO

## 2020-11-09 DIAGNOSIS — H02831 Dermatochalasis of right upper eyelid: Secondary | ICD-10-CM | POA: Diagnosis not present

## 2020-11-09 DIAGNOSIS — H02834 Dermatochalasis of left upper eyelid: Secondary | ICD-10-CM | POA: Diagnosis not present

## 2020-11-09 DIAGNOSIS — H02835 Dermatochalasis of left lower eyelid: Secondary | ICD-10-CM | POA: Diagnosis not present

## 2020-11-09 DIAGNOSIS — H02832 Dermatochalasis of right lower eyelid: Secondary | ICD-10-CM | POA: Diagnosis not present

## 2021-01-17 ENCOUNTER — Ambulatory Visit (INDEPENDENT_AMBULATORY_CARE_PROVIDER_SITE_OTHER): Payer: Medicare Other | Admitting: Plastic Surgery

## 2021-01-17 ENCOUNTER — Encounter: Payer: Self-pay | Admitting: Plastic Surgery

## 2021-01-17 ENCOUNTER — Other Ambulatory Visit: Payer: Self-pay

## 2021-01-17 VITALS — BP 143/76 | HR 85 | Ht 62.0 in | Wt 180.0 lb

## 2021-01-17 DIAGNOSIS — H02403 Unspecified ptosis of bilateral eyelids: Secondary | ICD-10-CM

## 2021-01-17 DIAGNOSIS — H02834 Dermatochalasis of left upper eyelid: Secondary | ICD-10-CM

## 2021-01-17 DIAGNOSIS — H02831 Dermatochalasis of right upper eyelid: Secondary | ICD-10-CM

## 2021-01-17 NOTE — Progress Notes (Addendum)
Referring Provider Lupita Raider, MD [address] [place],  [place] [address]   CC:  Bilateral eyelid ptosis, left greater than right  Adriana Oliver is an 70 y.o. female.  HPI:  The patient is a 70 year old female. She has some gross left-sided facial weakness.  It does not appear to be a Bell's palsy.  Her primary care physician has excluded stroke as well.  It is most evident with her upper eyelid on the left.  She has both brow and lid with ptosis.  There is improvement with taping of the brow.  The lid still lags quite a bit on the left.  She has some ptosis on the right side as well on the left upper lid.    Her ophthalmologist is Dr. Nile Riggs. She has had a visual field test since her last visit.  She is also possibly interested in lower blepharoplasty.  Allergies  Allergen Reactions   Nasal Spray Nausea And Vomiting   Oxycodone-Acetaminophen Nausea And Vomiting   Peg 3350-Kcl-Na Bicarb-Nacl Nausea And Vomiting    Outpatient Encounter Medications as of 01/17/2021  Medication Sig   acetaminophen (TYLENOL) 650 MG CR tablet Take 650 mg by mouth every 8 (eight) hours as needed.   albuterol (PROAIR HFA) 108 (90 Base) MCG/ACT inhaler Inhale 2 puffs into the lungs 4 (four) times daily as needed for wheezing or shortness of breath.   benzonatate (TESSALON) 100 MG capsule Take 100 mg by mouth 3 (three) times daily as needed.    levothyroxine (SYNTHROID) 50 MCG tablet Take 560 mcg by mouth daily.   omeprazole (PRILOSEC) 20 MG capsule before first and last meals daily   SYMBICORT 160-4.5 MCG/ACT inhaler Inhale 2 puffs into the lungs 2 (two) times daily.   No facility-administered encounter medications on file as of 01/17/2021.     Past Medical History:  Diagnosis Date   COPD (chronic obstructive pulmonary disease) (HCC)    DOE (dyspnea on exertion)    Hyperlipidemia    Hypothyroidism    Incontinence    Obesity    Osteopenia    Prediabetes     Past Surgical  History:  Procedure Laterality Date   APPENDECTOMY     RHINOPLASTY      Family History  Problem Relation Age of Onset   Emphysema Mother        deceased   COPD Mother    Diabetes Sister    Arthritis Sister    Diabetes Sister     Social history: No tobacco use.  No illicit drugs.    Review of Systems General: Denies fevers, chills, weight loss CV: Denies chest pain, shortness of breath, palpitations   Physical Exam Vitals with BMI 01/17/2021 11/05/2020 04/09/2019  Height 5\' 2"  5\' 2"  5\' 2"   Weight 180 lbs 184 lbs 13 oz 199 lbs 6 oz  BMI 32.91 33.79 36.46  Systolic 143 141  Diastolic 76 71 90  Pulse 85 72 76    General:  No acute distress,  Alert and oriented, Non-Toxic, Normal speech and affect HEENT: The patient has significant bilateral eyelid ptosis and dermatochalasis.  Her MRD is 1 on the left and 1.5 on the right.  Appear clear today.  Bilateral lower eyelids with excess skin and some puffiness.  She does have descent of her lid cheek junction bilaterally.  She has motion in all CN VII distributions despite reporting some facial weakness.  Additional studies: Taped and untaped visual field test  were reviewed and show greater than 20 degree improvement with taping.  She has significant visual obstruction when her lids are untaped.  Assessment/Plan The patient is a candidate for bilateral ptosis repair of the upper eyelid.  Visual field taped and untaped exam shows improvement of vision that is significant with taping.  Patient is aware of the possibility of corneal decompensation with ptosis surgery.  The patient is also interested in cosmetic surgery of the lower eyelids.    Time based coding: 30 minutes were spent with the patient.  Time was spent reviewing records, discussing surgical procedures with the patient and reviewing risks and benefits.     Adriana Oliver 01/17/2021, 9:23 AM

## 2021-01-18 ENCOUNTER — Encounter: Payer: Self-pay | Admitting: Plastic Surgery

## 2021-01-19 DIAGNOSIS — K5730 Diverticulosis of large intestine without perforation or abscess without bleeding: Secondary | ICD-10-CM | POA: Diagnosis not present

## 2021-01-19 DIAGNOSIS — K648 Other hemorrhoids: Secondary | ICD-10-CM | POA: Diagnosis not present

## 2021-01-19 DIAGNOSIS — Z86010 Personal history of colonic polyps: Secondary | ICD-10-CM | POA: Diagnosis not present

## 2021-01-25 DIAGNOSIS — M7542 Impingement syndrome of left shoulder: Secondary | ICD-10-CM | POA: Diagnosis not present

## 2021-01-25 DIAGNOSIS — S46012D Strain of muscle(s) and tendon(s) of the rotator cuff of left shoulder, subsequent encounter: Secondary | ICD-10-CM | POA: Diagnosis not present

## 2021-01-27 DIAGNOSIS — S46012D Strain of muscle(s) and tendon(s) of the rotator cuff of left shoulder, subsequent encounter: Secondary | ICD-10-CM | POA: Diagnosis not present

## 2021-01-27 DIAGNOSIS — M7542 Impingement syndrome of left shoulder: Secondary | ICD-10-CM | POA: Diagnosis not present

## 2021-02-01 DIAGNOSIS — M7542 Impingement syndrome of left shoulder: Secondary | ICD-10-CM | POA: Diagnosis not present

## 2021-02-01 DIAGNOSIS — S46012D Strain of muscle(s) and tendon(s) of the rotator cuff of left shoulder, subsequent encounter: Secondary | ICD-10-CM | POA: Diagnosis not present

## 2021-02-02 DIAGNOSIS — M25512 Pain in left shoulder: Secondary | ICD-10-CM | POA: Diagnosis not present

## 2021-02-03 DIAGNOSIS — S46012D Strain of muscle(s) and tendon(s) of the rotator cuff of left shoulder, subsequent encounter: Secondary | ICD-10-CM | POA: Diagnosis not present

## 2021-02-03 DIAGNOSIS — M7542 Impingement syndrome of left shoulder: Secondary | ICD-10-CM | POA: Diagnosis not present

## 2021-02-04 DIAGNOSIS — M75112 Incomplete rotator cuff tear or rupture of left shoulder, not specified as traumatic: Secondary | ICD-10-CM | POA: Diagnosis not present

## 2021-02-04 DIAGNOSIS — M25512 Pain in left shoulder: Secondary | ICD-10-CM | POA: Diagnosis not present

## 2021-02-14 DIAGNOSIS — S46012D Strain of muscle(s) and tendon(s) of the rotator cuff of left shoulder, subsequent encounter: Secondary | ICD-10-CM | POA: Diagnosis not present

## 2021-02-14 DIAGNOSIS — M7542 Impingement syndrome of left shoulder: Secondary | ICD-10-CM | POA: Diagnosis not present

## 2021-02-16 DIAGNOSIS — S46012D Strain of muscle(s) and tendon(s) of the rotator cuff of left shoulder, subsequent encounter: Secondary | ICD-10-CM | POA: Diagnosis not present

## 2021-02-16 DIAGNOSIS — M7542 Impingement syndrome of left shoulder: Secondary | ICD-10-CM | POA: Diagnosis not present

## 2021-02-21 DIAGNOSIS — S46012D Strain of muscle(s) and tendon(s) of the rotator cuff of left shoulder, subsequent encounter: Secondary | ICD-10-CM | POA: Diagnosis not present

## 2021-02-21 DIAGNOSIS — M7542 Impingement syndrome of left shoulder: Secondary | ICD-10-CM | POA: Diagnosis not present

## 2021-02-22 DIAGNOSIS — E039 Hypothyroidism, unspecified: Secondary | ICD-10-CM | POA: Diagnosis not present

## 2021-02-22 DIAGNOSIS — Z0000 Encounter for general adult medical examination without abnormal findings: Secondary | ICD-10-CM | POA: Diagnosis not present

## 2021-02-22 DIAGNOSIS — M899 Disorder of bone, unspecified: Secondary | ICD-10-CM | POA: Diagnosis not present

## 2021-02-22 DIAGNOSIS — E669 Obesity, unspecified: Secondary | ICD-10-CM | POA: Diagnosis not present

## 2021-02-22 DIAGNOSIS — N393 Stress incontinence (female) (male): Secondary | ICD-10-CM | POA: Diagnosis not present

## 2021-02-22 DIAGNOSIS — R7301 Impaired fasting glucose: Secondary | ICD-10-CM | POA: Diagnosis not present

## 2021-02-22 DIAGNOSIS — K589 Irritable bowel syndrome without diarrhea: Secondary | ICD-10-CM | POA: Diagnosis not present

## 2021-02-22 DIAGNOSIS — E782 Mixed hyperlipidemia: Secondary | ICD-10-CM | POA: Diagnosis not present

## 2021-02-22 DIAGNOSIS — N3281 Overactive bladder: Secondary | ICD-10-CM | POA: Diagnosis not present

## 2021-02-22 DIAGNOSIS — J45909 Unspecified asthma, uncomplicated: Secondary | ICD-10-CM | POA: Diagnosis not present

## 2021-02-22 DIAGNOSIS — M1990 Unspecified osteoarthritis, unspecified site: Secondary | ICD-10-CM | POA: Diagnosis not present

## 2021-02-22 DIAGNOSIS — Z23 Encounter for immunization: Secondary | ICD-10-CM | POA: Diagnosis not present

## 2021-02-23 DIAGNOSIS — S46012D Strain of muscle(s) and tendon(s) of the rotator cuff of left shoulder, subsequent encounter: Secondary | ICD-10-CM | POA: Diagnosis not present

## 2021-02-23 DIAGNOSIS — M7542 Impingement syndrome of left shoulder: Secondary | ICD-10-CM | POA: Diagnosis not present

## 2021-02-28 DIAGNOSIS — S46012D Strain of muscle(s) and tendon(s) of the rotator cuff of left shoulder, subsequent encounter: Secondary | ICD-10-CM | POA: Diagnosis not present

## 2021-02-28 DIAGNOSIS — M7542 Impingement syndrome of left shoulder: Secondary | ICD-10-CM | POA: Diagnosis not present

## 2021-03-01 DIAGNOSIS — M7542 Impingement syndrome of left shoulder: Secondary | ICD-10-CM | POA: Diagnosis not present

## 2021-03-01 DIAGNOSIS — S46012D Strain of muscle(s) and tendon(s) of the rotator cuff of left shoulder, subsequent encounter: Secondary | ICD-10-CM | POA: Diagnosis not present

## 2021-03-09 DIAGNOSIS — M7542 Impingement syndrome of left shoulder: Secondary | ICD-10-CM | POA: Diagnosis not present

## 2021-03-09 DIAGNOSIS — S46012D Strain of muscle(s) and tendon(s) of the rotator cuff of left shoulder, subsequent encounter: Secondary | ICD-10-CM | POA: Diagnosis not present

## 2021-03-10 DIAGNOSIS — M7542 Impingement syndrome of left shoulder: Secondary | ICD-10-CM | POA: Diagnosis not present

## 2021-03-10 DIAGNOSIS — S46012D Strain of muscle(s) and tendon(s) of the rotator cuff of left shoulder, subsequent encounter: Secondary | ICD-10-CM | POA: Diagnosis not present

## 2021-03-17 DIAGNOSIS — S46012D Strain of muscle(s) and tendon(s) of the rotator cuff of left shoulder, subsequent encounter: Secondary | ICD-10-CM | POA: Diagnosis not present

## 2021-03-17 DIAGNOSIS — M7542 Impingement syndrome of left shoulder: Secondary | ICD-10-CM | POA: Diagnosis not present

## 2021-03-21 DIAGNOSIS — M7542 Impingement syndrome of left shoulder: Secondary | ICD-10-CM | POA: Diagnosis not present

## 2021-03-21 DIAGNOSIS — S46012D Strain of muscle(s) and tendon(s) of the rotator cuff of left shoulder, subsequent encounter: Secondary | ICD-10-CM | POA: Diagnosis not present

## 2021-03-23 DIAGNOSIS — M7542 Impingement syndrome of left shoulder: Secondary | ICD-10-CM | POA: Diagnosis not present

## 2021-03-23 DIAGNOSIS — S46012D Strain of muscle(s) and tendon(s) of the rotator cuff of left shoulder, subsequent encounter: Secondary | ICD-10-CM | POA: Diagnosis not present

## 2021-10-28 DIAGNOSIS — R5383 Other fatigue: Secondary | ICD-10-CM | POA: Diagnosis not present

## 2021-11-02 DIAGNOSIS — J45909 Unspecified asthma, uncomplicated: Secondary | ICD-10-CM | POA: Diagnosis not present

## 2021-11-02 DIAGNOSIS — F411 Generalized anxiety disorder: Secondary | ICD-10-CM | POA: Diagnosis not present

## 2021-11-02 DIAGNOSIS — L740 Miliaria rubra: Secondary | ICD-10-CM | POA: Diagnosis not present

## 2022-03-15 DIAGNOSIS — Z0000 Encounter for general adult medical examination without abnormal findings: Secondary | ICD-10-CM | POA: Diagnosis not present

## 2022-03-15 DIAGNOSIS — E782 Mixed hyperlipidemia: Secondary | ICD-10-CM | POA: Diagnosis not present

## 2022-03-15 DIAGNOSIS — E039 Hypothyroidism, unspecified: Secondary | ICD-10-CM | POA: Diagnosis not present

## 2022-03-15 DIAGNOSIS — N393 Stress incontinence (female) (male): Secondary | ICD-10-CM | POA: Diagnosis not present

## 2022-03-15 DIAGNOSIS — N3281 Overactive bladder: Secondary | ICD-10-CM | POA: Diagnosis not present

## 2022-03-15 DIAGNOSIS — F411 Generalized anxiety disorder: Secondary | ICD-10-CM | POA: Diagnosis not present

## 2022-03-15 DIAGNOSIS — Z23 Encounter for immunization: Secondary | ICD-10-CM | POA: Diagnosis not present

## 2022-03-15 DIAGNOSIS — J45909 Unspecified asthma, uncomplicated: Secondary | ICD-10-CM | POA: Diagnosis not present

## 2022-03-15 DIAGNOSIS — R7301 Impaired fasting glucose: Secondary | ICD-10-CM | POA: Diagnosis not present

## 2022-04-06 DIAGNOSIS — Z1231 Encounter for screening mammogram for malignant neoplasm of breast: Secondary | ICD-10-CM | POA: Diagnosis not present

## 2022-04-06 DIAGNOSIS — Z780 Asymptomatic menopausal state: Secondary | ICD-10-CM | POA: Diagnosis not present

## 2022-04-06 DIAGNOSIS — M8589 Other specified disorders of bone density and structure, multiple sites: Secondary | ICD-10-CM | POA: Diagnosis not present

## 2022-05-24 ENCOUNTER — Ambulatory Visit: Payer: Medicare PPO | Attending: Family Medicine

## 2022-05-24 ENCOUNTER — Other Ambulatory Visit: Payer: Self-pay

## 2022-05-24 DIAGNOSIS — R159 Full incontinence of feces: Secondary | ICD-10-CM | POA: Diagnosis not present

## 2022-05-24 DIAGNOSIS — R152 Fecal urgency: Secondary | ICD-10-CM | POA: Diagnosis not present

## 2022-05-24 DIAGNOSIS — M6281 Muscle weakness (generalized): Secondary | ICD-10-CM | POA: Insufficient documentation

## 2022-05-24 DIAGNOSIS — R279 Unspecified lack of coordination: Secondary | ICD-10-CM | POA: Diagnosis not present

## 2022-05-24 NOTE — Therapy (Addendum)
OUTPATIENT PHYSICAL THERAPY FEMALE PELVIC EVALUATION   Patient Name: Adriana Oliver MRN: [id number] DOB:1950-08-03, 72 y.o., female Today's Date: 05/24/2022  END OF SESSION:  PT End of Session - 05/24/22 1629     Visit Number 1    Date for PT Re-Evaluation 08/02/22    Authorization Type [employer]    PT Start Time [id number]    PT Stop Time 1700    PT Time Calculation (min) 45 min    Activity Tolerance No increased pain    Behavior During Therapy WFL for tasks assessed/performed             Past Medical History:  Diagnosis Date   COPD (chronic obstructive pulmonary disease) ([place])    DOE (dyspnea on exertion)    Hyperlipidemia    Hypothyroidism    Incontinence    Obesity    Osteopenia    Prediabetes    Past Surgical History:  Procedure Laterality Date   APPENDECTOMY     RHINOPLASTY     Patient Active Problem List   Diagnosis Date Noted   Dermatochalasis of both eyelids 11/05/2020   Acquired ptosis of eyelid, bilateral 11/05/2020   Exertional dyspnea 10/08/2015   Abnormal ECG 10/08/2015   Hyperlipidemia 10/08/2015   Obesity (BMI 35.0-39.9 without comorbidity) 10/08/2015   Asthma 01/18/2011    PCP: Mayra Neer, MD  REFERRING PROVIDER: Mayra Neer, MD  REFERRING DIAG: N39.3 (ICD-10-CM) - Stress incontinence (female) (female)  THERAPY DIAG:  Full incontinence of feces  Fecal urgency  Muscle weakness (generalized)  Unspecified lack of coordination  Rationale for Evaluation and Treatment: Rehabilitation  ONSET DATE: chronic  SUBJECTIVE:                                                                                                                                                                                           05/24/22 SUBJECTIVE STATEMENT: Pt states that every time she stands up she passes gas outside of her control.  Fluid intake: Yes: no details this session    PAIN:  Are you having pain? No   PRECAUTIONS: None  WEIGHT  BEARING RESTRICTIONS: No  FALLS:  Has patient fallen in last 6 months? No  LIVING ENVIRONMENT: Lives with: lives with their family and lives with their spouse Lives in: House/apartment   OCCUPATION: retired  PLOF: Independent  PATIENT GOALS: get control over bowel movements   PERTINENT HISTORY:  COPD, appendectomy, DOE, hypothyroidism, prediabetic Sexual abuse: No  BOWEL MOVEMENT: Pain with bowel movement: Yes - has IBS Type of bowel movement:Type (Bristol Stool Scale) 1-7, Frequency at least 1x/day and sometimes many times a day; does  not have bowel movements when traveling, and Strain No Fully empty rectum: No - does not matter how long she sits there Leakage: Yes: 1-2x/week, varying in amount Pads: No - does not want to use pads  Fiber supplement: No She can go to the bathroom up to 4x/hour  URINATION: Pain with urination: No Fully empty bladder: Yes: - Stream: Strong Urgency: No Frequency: WNL Leakage:  None Pads: No  INTERCOURSE: Not currently sexually active with partner [place]ax: yes - on her own   PREGNANCY: Vaginal deliveries 4 Tearing Yes: all had stitches - unsure of degree  C-section deliveries 0 Currently pregnant No  PROLAPSE: None   OBJECTIVE:  05/24/22:  COGNITION: Overall cognitive status: Within functional limits for tasks assessed     SENSATION: Light touch: Appears intact Proprioception: Appears intact  MUSCLE LENGTH:   FUNCTIONAL TESTS:    GAIT: Comments: decreased hip extension  POSTURE: rounded shoulders, forward head, decreased lumbar lordosis, decreased thoracic kyphosis, and posterior pelvic tilt  PELVIC ALIGNMENT:  LUMBARAROM/PROM:  A/PROM A/PROM  eval  Flexion   Extension   Right lateral flexion   Left lateral flexion   Right rotation   Left rotation    (Blank rows = not tested)  LOWER EXTREMITY ROM:   LOWER EXTREMITY MMT:  MMT Right eval Left eval  Hip flexion    Hip extension    Hip abduction     Hip adduction    Hip internal rotation    Hip external rotation    Knee flexion    Knee extension    Ankle dorsiflexion    Ankle plantarflexion    Ankle inversion    Ankle eversion     PALPATION:   General  no tenderness in abdomen                External Perineal Exam significant scar tissue restriction through perineal body to/into anus (not able to tell extent at this time); skin irritation surrounding anus with fecal residue present                             Internal Pelvic Floor no tenderness with rectal exam  Patient confirms identification and approves PT to assess internal pelvic floor and treatment Yes  PELVIC MMT:   MMT eval  Vaginal   Internal Anal Sphincter   External Anal Sphincter 1/5, endurance 2 seconds  Puborectalis 2/5, endurance 2 seconds  Diastasis Recti 2 finger width separation at umbilicus  (Blank rows = not tested)        TONE: low  PROLAPSE: Grade 1 posterior vaginal wall laxity observed externally with bearing down; pt initially contracting with bear down and required cues to bulge  TODAY'S TREATMENT:                                                                                                                              DATE:  05/24/22  EVAL  Neuromuscular re-education: Pelvic floor contraction training Quick flicks Long holds Therapeutic activities: Squatty potty Leaning back with bowel movements Urge drill for bowel movements Fiber Peri bottle/wet wipes for gentle cleansing     PATIENT EDUCATION:  Education details: see above Person educated: Patient Education method: Explanation, Demonstration, Tactile cues, Verbal cues, and Handouts Education comprehension: verbalized understanding  HOME EXERCISE PROGRAM: [id number]  ASSESSMENT:  CLINICAL IMPRESSION: Patient is a 72 y.o. female who was seen today for physical therapy evaluation and treatment for fecal urgency/incontinence and gas incontinence. Rectal exam performed  and findings notable for external anal sphincter and posterior levator ani weakness, decreased pelvic floor endurance, poor coordination with bearing down (contract instead of lengthen), and skin irritation surrounding anus; external vaginal exam showed posterior vaginal wall laxity. Signs and symptoms are most consistent with poor external anal sphincter/pelvic floor weakness/endurance/motor control, posterior vaginal wall laxity, poor bowel habits, and poor consistency of bowel movements. Initial treatment consisted of pelvic floor/external anal sphincter contraction training, quick flicks/long holds, urge drill, fiber supplementation, and toilet mechanics. She will continue to benefit from skilled PT intervention in order to improve fecal/gas incontinence, reduce fecal urgency/frequency, and improve QOL.   OBJECTIVE IMPAIRMENTS: decreased activity tolerance, decreased coordination, decreased endurance, decreased strength, increased fascial restrictions, increased muscle spasms, postural dysfunction, and pain.   ACTIVITY LIMITATIONS: continence  PARTICIPATION LIMITATIONS: community activity  PERSONAL FACTORS: Age and Fitness are also affecting patient's functional outcome.   REHAB POTENTIAL: Good  CLINICAL DECISION MAKING: Stable/uncomplicated  EVALUATION COMPLEXITY: Low   GOALS: Goals reviewed with patient? Yes  SHORT TERM GOALS: Target date: 06/28/22  Pt will be independent with HEP.   Baseline: Goal status: INITIAL  2.  Pt will be independent with use of squatty potty, relaxed toileting mechanics, and improved bowel movement techniques in order to increase ease of bowel movements and complete evacuation.   Baseline:  Goal status: INITIAL  3.  Pt will be able to correctly perform diaphragmatic breathing and appropriate pressure management in order to prevent worsening vaginal wall laxity and improve pelvic floor A/ROM.   Baseline:  Goal status: INITIAL   LONG TERM GOALS:  Target date: 08/02/2022  Pt will be independent with advanced HEP.   Baseline:  Goal status: INITIAL  2.  Pt will demonstrate normal pelvic floor muscle tone and A/ROM, able to achieve 3/5 strength with contractions and 10 sec endurance, in order to provide appropriate lumbopelvic support in functional activities.   Baseline:  Goal status: INITIAL  3.  Pt will report no episodes of gas or fecal incontinence in order to improve confidence in community activities and personal hygiene.   Baseline:  Goal status: INITIAL  4.  Pt will report regular consistency of bowel movements at either 3 or 4 on Bristol Stool Scale in order to help complete emptying.  Baseline:  Goal status: INITIAL  5.  Pt will report no more than 3 bowel movements a day in order to decrease skin irritation in perineum and increase ease of community activity.  Baseline:  Goal status: INITIAL  PLAN:  PT FREQUENCY: 1-2x/week  PT DURATION: 10 weeks  PLANNED INTERVENTIONS: Therapeutic exercises, Therapeutic activity, Neuromuscular re-education, Balance training, Gait training, Patient/Family education, Self Care, Joint mobilization, Dry Needling, Biofeedback, and Manual therapy  PLAN FOR NEXT SESSION: Begin core training/progressions. Strict bowel retraining program.    Julio Alm, PT, DPT02/21/248:34 PM  PHYSICAL THERAPY DISCHARGE SUMMARY  Visits from Start of Care: 1  Current functional level  related to goals / functional outcomes: Not complete   Remaining deficits: See above   Education / Equipment: HEP   Patient agrees to discharge. Patient goals were not met. Patient is being discharged due to not returning since the last visit.  Julio Alm, PT, DPT04/16/243:57 PM

## 2022-05-24 NOTE — Patient Instructions (Addendum)
Squatty potty: When your knees are level or below the level of your hips, pelvic floor muscles are pressed against rectum, preventing ease of bowel movement. By getting knees above the level of the hips, these pelvic floor muscles relax, allowing easier passage of bowel movement.  Ways to get knees above hips: o Squatty Potty (7inch and 9inch versions) o Small stool o Roll of toilet paper under each foot o Hardback book or stack of magazines under each foot  Relaxed Toileting mechanics: Once in this position, make sure to lean forward with forearms on thighs, wide knees, relaxed stomach, and breathe. The rocking that you have found successful is fine!   Peri-bottle for cleaning  Consider a fiber supplement: psyllium husk    [facility] [address] [place], [place] [address] Phone # [phone] Fax [phone]

## 2022-07-10 DIAGNOSIS — R059 Cough, unspecified: Secondary | ICD-10-CM | POA: Diagnosis not present

## 2022-07-10 DIAGNOSIS — R062 Wheezing: Secondary | ICD-10-CM | POA: Diagnosis not present

## 2022-07-10 DIAGNOSIS — R0602 Shortness of breath: Secondary | ICD-10-CM | POA: Diagnosis not present

## 2022-07-10 DIAGNOSIS — J441 Chronic obstructive pulmonary disease with (acute) exacerbation: Secondary | ICD-10-CM | POA: Diagnosis not present

## 2022-07-11 ENCOUNTER — Ambulatory Visit: Payer: Medicare PPO | Attending: Family Medicine

## 2022-07-11 DIAGNOSIS — R279 Unspecified lack of coordination: Secondary | ICD-10-CM | POA: Insufficient documentation

## 2022-07-11 DIAGNOSIS — R159 Full incontinence of feces: Secondary | ICD-10-CM | POA: Insufficient documentation

## 2022-07-11 DIAGNOSIS — R152 Fecal urgency: Secondary | ICD-10-CM | POA: Insufficient documentation

## 2022-07-11 DIAGNOSIS — M6281 Muscle weakness (generalized): Secondary | ICD-10-CM | POA: Insufficient documentation

## 2022-07-12 DIAGNOSIS — J449 Chronic obstructive pulmonary disease, unspecified: Secondary | ICD-10-CM | POA: Diagnosis not present

## 2022-07-12 DIAGNOSIS — J45909 Unspecified asthma, uncomplicated: Secondary | ICD-10-CM | POA: Diagnosis not present

## 2022-07-18 ENCOUNTER — Ambulatory Visit: Payer: Medicare PPO

## 2022-07-26 DIAGNOSIS — J441 Chronic obstructive pulmonary disease with (acute) exacerbation: Secondary | ICD-10-CM | POA: Diagnosis not present

## 2022-07-28 DIAGNOSIS — J449 Chronic obstructive pulmonary disease, unspecified: Secondary | ICD-10-CM | POA: Diagnosis not present

## 2022-08-02 ENCOUNTER — Encounter (HOSPITAL_COMMUNITY): Payer: Self-pay

## 2022-08-02 ENCOUNTER — Other Ambulatory Visit: Payer: Self-pay

## 2022-08-02 ENCOUNTER — Emergency Department (HOSPITAL_COMMUNITY)
Admission: EM | Admit: 2022-08-02 | Discharge: 2022-08-02 | Disposition: A | Discharge status: Home / Self Care | Payer: Medicare PPO | Attending: Emergency Medicine | Admitting: Emergency Medicine

## 2022-08-02 ENCOUNTER — Emergency Department (HOSPITAL_COMMUNITY): Payer: Medicare PPO

## 2022-08-02 DIAGNOSIS — J181 Lobar pneumonia, unspecified organism: Secondary | ICD-10-CM | POA: Insufficient documentation

## 2022-08-02 DIAGNOSIS — J189 Pneumonia, unspecified organism: Secondary | ICD-10-CM | POA: Diagnosis not present

## 2022-08-02 DIAGNOSIS — R0602 Shortness of breath: Secondary | ICD-10-CM | POA: Diagnosis not present

## 2022-08-02 DIAGNOSIS — D649 Anemia, unspecified: Secondary | ICD-10-CM | POA: Diagnosis not present

## 2022-08-02 DIAGNOSIS — J449 Chronic obstructive pulmonary disease, unspecified: Secondary | ICD-10-CM | POA: Insufficient documentation

## 2022-08-02 DIAGNOSIS — R000 Tachycardia, unspecified: Secondary | ICD-10-CM | POA: Insufficient documentation

## 2022-08-02 DIAGNOSIS — Z1152 Encounter for screening for COVID-19: Secondary | ICD-10-CM | POA: Insufficient documentation

## 2022-08-02 DIAGNOSIS — R059 Cough, unspecified: Secondary | ICD-10-CM | POA: Diagnosis not present

## 2022-08-02 DIAGNOSIS — R0789 Other chest pain: Secondary | ICD-10-CM | POA: Diagnosis not present

## 2022-08-02 LAB — RESP PANEL BY RT-PCR (RSV, FLU A&B, COVID)  RVPGX2
Influenza A by PCR: NEGATIVE
Influenza B by PCR: NEGATIVE
Resp Syncytial Virus by PCR: NEGATIVE
SARS Coronavirus 2 by RT PCR: NEGATIVE

## 2022-08-02 LAB — TROPONIN I (HIGH SENSITIVITY): Troponin I (High Sensitivity): 10 ng/L (ref <18)

## 2022-08-02 LAB — BASIC METABOLIC PANEL
Anion gap: 10 (ref 5–15)
BUN: 13 mg/dL (ref 8–23)
CO2: 27 mmol/L (ref 22–32)
Calcium: 8.5 mg/dL — ABNORMAL LOW (ref 8.9–10.3)
Chloride: 101 mmol/L (ref 98–111)
Creatinine, Ser: 0.65 mg/dL (ref 0.44–1.00)
GFR, Estimated: >60 mL/min (ref >60)
Glucose, Bld: 125 mg/dL — ABNORMAL HIGH (ref 70–99)
Potassium: 3.8 mmol/L (ref 3.5–5.1)
Sodium: 138 mmol/L (ref 135–145)

## 2022-08-02 LAB — CBC
HCT: 33.5 % — ABNORMAL LOW (ref 36.0–46.0)
Hemoglobin: 11.2 g/dL — ABNORMAL LOW (ref 12.0–15.0)
MCH: 39.7 pg — ABNORMAL HIGH (ref 26.0–34.0)
MCHC: 33.4 g/dL (ref 30.0–36.0)
MCV: 118.8 fL — ABNORMAL HIGH (ref 80.0–100.0)
Platelets: 260 10*3/uL (ref 150–400)
RBC: 2.82 MIL/uL — ABNORMAL LOW (ref 3.87–5.11)
RDW: 13.2 % (ref 11.5–15.5)
WBC: 13.8 10*3/uL — ABNORMAL HIGH (ref 4.0–10.5)
nRBC: 0 % (ref 0.0–0.2)

## 2022-08-02 MED ORDER — AZITHROMYCIN 250 MG PO TABS
500.0000 mg | ORAL_TABLET | Freq: Once | ORAL | Status: AC
  Administered 2022-08-02: 500 mg via ORAL
  Filled 2022-08-02: qty 2

## 2022-08-02 MED ORDER — AMOXICILLIN 500 MG PO CAPS: 1000.0000 mg | ORAL_CAPSULE | Freq: Once | ORAL | Status: DC

## 2022-08-02 MED ORDER — HYDROCOD POLI-CHLORPHE POLI ER 10-8 MG/5ML PO SUER
5.0000 mL | Freq: Once | ORAL | Status: AC
  Administered 2022-08-02: 5 mL via ORAL
  Filled 2022-08-02: qty 5

## 2022-08-02 MED ORDER — AMOXICILLIN 500 MG PO CAPS: 1000.0000 mg | ORAL_CAPSULE | Freq: Three times a day (TID) | ORAL | 0 refills | Status: AC

## 2022-08-02 MED ORDER — AZITHROMYCIN 250 MG PO TABS: 250.0000 mg | ORAL_TABLET | Freq: Every day | ORAL | 0 refills | Status: DC

## 2022-08-02 MED ORDER — HYDROCOD POLI-CHLORPHE POLI ER 10-8 MG/5ML PO SUER: 5.0000 mL | Freq: Two times a day (BID) | ORAL | 0 refills | Status: DC | PRN

## 2022-08-02 MED ORDER — ONDANSETRON 4 MG PO TBDP
4.0000 mg | ORAL_TABLET | Freq: Once | ORAL | Status: AC
  Administered 2022-08-02: 4 mg via ORAL
  Filled 2022-08-02: qty 1

## 2022-08-02 MED ORDER — SODIUM CHLORIDE 0.9 % IV SOLN
1.0000 g | Freq: Once | INTRAVENOUS | Status: AC
  Administered 2022-08-02: 1 g via INTRAVENOUS
  Filled 2022-08-02: qty 10

## 2022-08-02 NOTE — Discharge Instructions (Signed)
Please read and follow all provided instructions.  Your diagnoses today include:  1. Community acquired pneumonia of right lower lobe of lung    Tests performed today include: Blood counts and electrolytes: Shows elevated white blood cell count pending intake form, mild anemia Chest x-ray -- shows pneumonia in the right lower lobe of the lung Vital signs. See below for your results today.   Medications prescribed:  Amoxicillin - antibiotic  You have been prescribed an antibiotic medicine: take the entire course of medicine even if you are feeling better. Stopping early can cause the antibiotic not to work.  Azithromycin - antibiotic for respiratory infection  You have been prescribed an antibiotic medicine: take the entire course of medicine even if you are feeling better. Stopping early can cause the antibiotic not to work.  Tussinex - narcotic cough suppressant syrup  You have been prescribed narcotic cough suppressant such as Tussinex: DO NOT drive or perform any activities that require you to be awake and alert because this medicine can make you drowsy.   Take any prescribed medications only as directed.  Home care instructions:  Follow any educational materials contained in this packet.  Take the complete course of antibiotics that you were prescribed.   BE VERY CAREFUL not to take multiple medicines containing Tylenol (also called acetaminophen). Doing so can lead to an overdose which can damage your liver and cause liver failure and possibly death.   Follow-up instructions: Please follow-up with your primary care provider in the next 3 days for further evaluation of your symptoms and to ensure resolution of your infection.   Return instructions:  Please return to the Emergency Department if you experience worsening symptoms.  Return immediately with worsening breathing, worsening shortness of breath, or if you feel it is taking you more effort to breathe.  Please return  if you have any other emergent concerns.  Additional Information:  Your vital signs today were: BP (!) 162/88   Pulse 92   Temp 98.9 F (37.2 C) (Oral)   Resp (!) 22   SpO2 92%  If your blood pressure (BP) was elevated above 135/85 this visit, please have this repeated by your doctor within one month. --------------

## 2022-08-02 NOTE — ED Provider Notes (Signed)
[place] EMERGENCY DEPARTMENT AT [facility] Provider Note   CSN: [id number] Arrival date & time: 08/02/22  1813     History  Chief Complaint  Patient presents with   Shortness of Breath   Chest Pain    Adriana Oliver is a 72 y.o. female.  Patient presents to the emergency department today for evaluation of cough and shortness of breath.  She has had symptoms for about 3 weeks.  She is seen her doctor 3 times and states that she was treated for bronchitis with albuterol, narcotic cough syrup, Tessalon Perles, prednisone.  Her symptoms have not improved.  She has become progressively more short of breath.  She has had subjective fevers at night.  She has had nasal congestion.  She reports significant pain and discomfort across her bilateral ribs due to coughing.  She has been spitting up mucus.  No lower extremity edema.  She states history of COPD, no smoking history but parents smoked.  No history of heart failure.       Home Medications Prior to Admission medications   Medication Sig Start Date End Date Taking? Authorizing Provider  acetaminophen (TYLENOL) 650 MG CR tablet Take 650 mg by mouth every 8 (eight) hours as needed.    [provider]  albuterol (PROAIR HFA) 108 (90 Base) MCG/ACT inhaler Inhale 2 puffs into the lungs 4 (four) times daily as needed for wheezing or shortness of breath. 04/09/19   Charlott Holler, MD  benzonatate (TESSALON) 100 MG capsule Take 100 mg by mouth 3 (three) times daily as needed.  04/15/12   [provider]  levothyroxine (SYNTHROID) 50 MCG tablet Take 560 mcg by mouth daily. 02/06/19   [provider]  omeprazole (PRILOSEC) 20 MG capsule before first and last meals daily 02/01/11   Nyoka Cowden, MD  SYMBICORT 160-4.5 MCG/ACT inhaler Inhale 2 puffs into the lungs 2 (two) times daily. 04/09/19   Charlott Holler, MD      Allergies    Nasal spray, Oxycodone-acetaminophen, and Peg [id number] bicarb-nacl     Review of Systems   Review of Systems  Physical Exam Updated Vital Signs BP 126/70 (BP Location: Right Arm)   Pulse 88   Temp 98.9 F (37.2 C) (Oral)   Resp (!) 22   SpO2 91%   Physical Exam Vitals and nursing note reviewed.  Constitutional:      General: She is in acute distress.     Appearance: She is well-developed.     Comments: Patient appears very uncomfortable, coughing frequently during exam.  HENT:     Head: Normocephalic and atraumatic.     Right Ear: External ear normal.     Left Ear: External ear normal.     Nose: Nose normal.  Eyes:     Conjunctiva/sclera: Conjunctivae normal.  Cardiovascular:     Rate and Rhythm: Regular rhythm. Tachycardia present.     Heart sounds: No murmur heard.    Comments: Mild tachycardia Pulmonary:     Effort: No respiratory distress.     Breath sounds: Examination of the right-lower field reveals rales. Examination of the left-lower field reveals rales. Rales present. No wheezing or rhonchi.  Abdominal:     Palpations: Abdomen is soft.     Tenderness: There is no abdominal tenderness. There is no guarding or rebound.  Musculoskeletal:     Cervical back: Normal range of motion and neck supple.     Right lower leg: No edema.  Left lower leg: No edema.  Skin:    General: Skin is warm and dry.     Findings: No rash.  Neurological:     General: No focal deficit present.     Mental Status: She is alert. Mental status is at baseline.     Motor: No weakness.  Psychiatric:        Mood and Affect: Mood normal.     ED Results / Procedures / Treatments   Labs (all labs ordered are listed, but only abnormal results are displayed) Labs Reviewed  BASIC METABOLIC PANEL - Abnormal; Notable for the following components:      Result Value   Glucose, Bld 125 (*)    Calcium 8.5 (*)    All other components within normal limits  CBC - Abnormal; Notable for the following components:   WBC 13.8 (*)    RBC 2.82 (*)    Hemoglobin  11.2 (*)    HCT 33.5 (*)    MCV 118.8 (*)    MCH 39.7 (*)    All other components within normal limits  RESP PANEL BY RT-PCR (RSV, FLU A&B, COVID)  [id number]  TROPONIN I (HIGH SENSITIVITY)    ED ECG REPORT   Date: 08/02/2022  Rate: 91  Rhythm: normal sinus rhythm and premature atrial contractions (PAC)  QRS Axis: normal  Intervals: normal  ST/T Wave abnormalities: normal  Conduction Disutrbances:none  Narrative Interpretation:   Old EKG Reviewed: changes noted, lateral twaves improved from 2017  I have personally reviewed the EKG tracing and agree with the computerized printout as noted.   Radiology DG Chest 2 View  Result Date: 08/02/2022 CLINICAL DATA:  Shortness of breath, congestion, and cough EXAM: CHEST - 2 VIEW COMPARISON:  02/07/2019 FINDINGS: Hazy airspace and interstitial opacities in the right lower lung suggestive of pneumonia. The left lung is clear. No pleural effusion or pneumothorax. Stable cardiomediastinal silhouette. No displaced rib fractures. IMPRESSION: Right lower lung pneumonia. Electronically Signed   By: Minerva Fester M.D.   On: 08/02/2022 19:11    Procedures Procedures    Medications Ordered in ED Medications  chlorpheniramine-HYDROcodone (TUSSIONEX) 10-8 MG/5ML suspension 5 mL (5 mLs Oral Given 08/02/22 2200)  azithromycin (ZITHROMAX) tablet 500 mg (500 mg Oral Given 08/02/22 2229)  cefTRIAXone (ROCEPHIN) 1 g in sodium chloride 0.9 % 100 mL IVPB (1 g Intravenous New Bag/Given 08/02/22 2228)    ED Course/ Medical Decision Making/ A&P    Patient seen and examined. History obtained directly from patient. Work-up including labs, imaging, EKG ordered in triage, if performed, were reviewed.    Labs/EKG: Independently reviewed and interpreted.  This included: CBC elevated at 13.8 in setting of steroid use, macrocytic anemia; BMP unremarkable; troponin normal at 10.  Added COVID/flu.  Imaging: Independently visualized and interpreted.  This included: Chest  x-ray, agree right basilar pneumonia.  Medications/Fluids: Ordered: Tussionex, patient will need antibiotics.  Most recent vital signs reviewed and are as follows: BP 126/70 (BP Location: Right Arm)   Pulse 88   Temp 98.9 F (37.2 C) (Oral)   Resp (!) 22   SpO2 91%   Initial impression: Right lower lobe pneumonia  10:05 PM Per RN ambulated at 91-93%.   11:09 PM Reassessment performed. Patient appears much more comfortable now.  She states that her pain went from an 8 to a 2.  She was given a dose of Rocephin IV.  This is now complete.  Patient is comfortable with going home.  Oxygen saturation  93% on room air at rest.  Reviewed pertinent lab work and imaging with patient at bedside. Questions answered.   Most current vital signs reviewed and are as follows: BP (!) 162/88   Pulse 92   Temp 98.9 F (37.2 C) (Oral)   Resp (!) 22   SpO2 92%   Plan: Discharge to home.   Prescriptions written for: Amoxicillin, azithromycin, Tussionex cough syrup.  Patient counseled on use of narcotic cough medications. Counseled not to combine these medications with others containing tylenol. Urged not to drink alcohol, drive, or perform any other activities that requires focus while taking these medications. The patient verbalizes understanding and agrees with the plan.  Other home care instructions discussed: Rest, hydration  ED return instructions discussed: Return with worsening shortness of breath, high persistent fever, persistent vomiting.   Follow-up instructions discussed: Patient encouraged to follow-up with their PCP in 3 days.                             Medical Decision Making Amount and/or Complexity of Data Reviewed Labs: ordered. Radiology: ordered.  Risk Prescription drug management.   Patient here with ongoing cough and worsening shortness of breath.  She has been treated for bronchitis as outpatient.  She was found to have a right lower lobe infiltrate on x-ray here  tonight.  She was very uncomfortable mainly due to coughing.  She has had oxygen saturations in the low 90s however she did not drop below 90 with ambulation here.  Chest pain was bilateral with coughing.  No clinical signs and symptoms of DVT.  Do not suspect ACS, PE.  No pneumothorax on exam.  Patient felt much better with treatment here in the emergency room.  She was also given a dose of IV Rocephin and oral azithromycin.  She would like to be discharged tonight and follow-up with her PCP.  Given that she has not yet been on antibiotics for her illness, I feel this is appropriate.  The patient's vital signs, pertinent lab work and imaging were reviewed and interpreted as discussed in the ED course. Hospitalization was considered for further testing, treatments, or serial exams/observation. However as patient is well-appearing, has a stable exam, and reassuring studies today, I do not feel that they warrant admission at this time. This plan was discussed with the patient who verbalizes agreement and comfort with this plan and seems reliable and able to return to the Emergency Department with worsening or changing symptoms.          Final Clinical Impression(s) / ED Diagnoses Final diagnoses:  Community acquired pneumonia of right lower lobe of lung    Rx / DC Orders ED Discharge Orders          Ordered    chlorpheniramine-HYDROcodone (TUSSIONEX) 10-8 MG/5ML  Every 12 hours PRN        08/02/22 2305    amoxicillin (AMOXIL) 500 MG capsule  3 times daily        08/02/22 2305    azithromycin (ZITHROMAX) 250 MG tablet  Daily        08/02/22 2305              Renne Crigler, PA-C 08/02/22 2316    Melene Plan, DO 08/03/22 1458

## 2022-08-02 NOTE — ED Notes (Signed)
Ambulatory SpO2 was 91% while walking, started with a SpO2 of 93%

## 2022-08-02 NOTE — ED Triage Notes (Signed)
Pt came in via POV d/t 3 weeks of being very congested & coughing. Pt reports seeing PCP & all Tx so far has not been relieved. Had abd pain from coughing & feels SOB.

## 2022-08-08 ENCOUNTER — Telehealth: Payer: Self-pay | Admitting: Pulmonary Disease

## 2022-08-08 ENCOUNTER — Encounter: Payer: Self-pay | Admitting: Pulmonary Disease

## 2022-08-08 ENCOUNTER — Ambulatory Visit: Payer: Medicare PPO | Admitting: Pulmonary Disease

## 2022-08-08 VITALS — BP 122/72 | HR 87 | Ht 62.0 in | Wt 175.8 lb

## 2022-08-08 DIAGNOSIS — J4541 Moderate persistent asthma with (acute) exacerbation: Secondary | ICD-10-CM | POA: Diagnosis not present

## 2022-08-08 MED ORDER — PREDNISONE 10 MG PO TABS
ORAL_TABLET | ORAL | 0 refills | Status: DC
Start: 2022-08-08 — End: 2022-08-31

## 2022-08-08 MED ORDER — MONTELUKAST SODIUM 10 MG PO TABS
10.0000 mg | ORAL_TABLET | Freq: Every day | ORAL | 11 refills | Status: DC
Start: 2022-08-08 — End: 2023-06-04

## 2022-08-08 NOTE — Patient Instructions (Addendum)
Start extended steroid taper:  40mg  daily x 3 days 30mg  daily x 3 days 20mg  daily x 3 days 10mg  daily x 3 days  Start montelukast 10mg  daily at bed time  Continue breztri inhaler 2 puffs twice dialy - rinse mouth out after each use  Continue albuterol inhaler 1-2 puffs every 4-6 hours  Use nebulizer treatment or albuterol inhaler every 4-6 hours.   Please send [place] a complete list of your medications when able via myChart  Follow up in 2 months, call sooner if needed

## 2022-08-08 NOTE — Telephone Encounter (Signed)
Tasia Catchings husband states patient currently taking Breztri and Albuterol solution. Pharmacy is [facility] [address] [address]. Tasia Catchings phone number is [phone].

## 2022-08-08 NOTE — Progress Notes (Signed)
Synopsis: Referred in May 2024 for shortness of breath  Subjective:   PATIENT ID: Adriana Oliver DOB: 10-04-1950, MRN: [id number]  HPI  Chief Complaint  Patient presents with   Establish Care    Former Dr. Celine Mans pt for DOE. States she was diagnosed with PNA last week. Still has a productive cough with clear phlegm.   Adriana Oliver is a 72 year old woman, never smoker who is referred to pulmonary clinic for asthma.   She was seen by Dr. Celine Mans 04/09/2019 for moderate persistent asthma. She was continued on symbicort 160-4.46mcg 2 puffs twice daily at that time.   She was treated for community acquired pneumonia with amoxicillin and azithromycin after an ER visit 08/02/22. Chest x-ray showed RLL opacity. She continues to have significant cough. She has been coughing up mucous, green to clear now. She is having mild night sweats, but no fevers are chills. She is having wheezing and shortness of breath.   Prior to her infection, her breathing was up and down.   She is a never smoker. Lives with her husband.   Past Medical History:  Diagnosis Date   COPD (chronic obstructive pulmonary disease) (HCC)    DOE (dyspnea on exertion)    Hyperlipidemia    Hypothyroidism    Incontinence    Obesity    Osteopenia    Prediabetes      Family History  Problem Relation Age of Onset   Emphysema Mother        deceased   COPD Mother    Diabetes Sister    Arthritis Sister    Diabetes Sister      Social History   Socioeconomic History   Marital status: Married    Spouse name: Not on file   Number of children: 4   Years of education: Not on file   Highest education level: Not on file  Occupational History   Occupation: Runner, broadcasting/film/video  Tobacco Use   Smoking status: Never   Smokeless tobacco: Never  Vaping Use   Vaping Use: Never used  Substance and Sexual Activity   Alcohol use: No   Drug use: No   Sexual activity: Not on file  Other Topics Concern   Not on file  Social  History Narrative   Not on file   Social Determinants of Health   Financial Resource Strain: Not on file  Food Insecurity: Not on file  Transportation Needs: Not on file  Physical Activity: Not on file  Stress: Not on file  Social Connections: Not on file  Intimate Partner Violence: Not on file     Allergies  Allergen Reactions   Nasal Spray Nausea And Vomiting   Oxycodone-Acetaminophen Nausea And Vomiting   Peg 3350-Kcl-Na Bicarb-Nacl Nausea And Vomiting     Outpatient Medications Prior to Visit  Medication Sig Dispense Refill   acetaminophen (TYLENOL) 650 MG CR tablet Take 650 mg by mouth every 8 (eight) hours as needed.     albuterol (PROAIR HFA) 108 (90 Base) MCG/ACT inhaler Inhale 2 puffs into the lungs 4 (four) times daily as needed for wheezing or shortness of breath. 18 g 5   BREZTRI AEROSPHERE 160-9-4.8 MCG/ACT AERO Inhale 2 puffs into the lungs 2 (two) times daily.     chlorpheniramine-HYDROcodone (TUSSIONEX) 10-8 MG/5ML Take 5 mLs by mouth every 12 (twelve) hours as needed for cough. 70 mL 0   escitalopram (LEXAPRO) 10 MG tablet Take 10 mg by mouth daily.  ezetimibe (ZETIA) 10 MG tablet Take 10 mg by mouth daily.     levothyroxine (SYNTHROID) 50 MCG tablet Take 560 mcg by mouth daily.     pravastatin (PRAVACHOL) 20 MG tablet Take 20 mg by mouth daily.     omeprazole (PRILOSEC) 20 MG capsule before first and last meals daily 60 capsule 11   azithromycin (ZITHROMAX) 250 MG tablet Take 1 tablet (250 mg total) by mouth daily. 4 tablet 0   benzonatate (TESSALON) 100 MG capsule Take 100 mg by mouth 3 (three) times daily as needed.      SYMBICORT 160-4.5 MCG/ACT inhaler Inhale 2 puffs into the lungs 2 (two) times daily. 1 Inhaler 5   No facility-administered medications prior to visit.    Review of Systems  Constitutional:  Negative for chills, fever, malaise/fatigue and weight loss.  HENT:  Positive for congestion. Negative for sinus pain and sore throat.   Eyes:  Negative.   Respiratory:  Positive for cough, sputum production and shortness of breath. Negative for hemoptysis and wheezing.   Cardiovascular:  Negative for chest pain, palpitations, orthopnea, claudication and leg swelling.  Gastrointestinal:  Positive for abdominal pain. Negative for heartburn, nausea and vomiting.  Genitourinary: Negative.   Musculoskeletal:  Negative for joint pain and myalgias.  Skin:  Negative for rash.  Neurological:  Negative for weakness.  Endo/Heme/Allergies: Negative.   Psychiatric/Behavioral: Negative.      Objective:   Vitals:   08/08/22 1426  BP: 122/72  Pulse: 87  SpO2: 95%  Weight: 175 lb 12.8 oz (79.7 kg)  Height: 5\' 2"  (1.575 m)     Physical Exam Constitutional:      General: She is not in acute distress.    Appearance: She is not ill-appearing.  HENT:     Head: Normocephalic and atraumatic.  Eyes:     General: No scleral icterus.    Conjunctiva/sclera: Conjunctivae normal.  Cardiovascular:     Rate and Rhythm: Normal rate and regular rhythm.     Pulses: Normal pulses.     Heart sounds: Normal heart sounds. No murmur heard. Pulmonary:     Effort: Pulmonary effort is normal.     Breath sounds: Wheezing and rhonchi present. No rales.  Musculoskeletal:     Right lower leg: No edema.     Left lower leg: No edema.  Lymphadenopathy:     Cervical: No cervical adenopathy.  Skin:    General: Skin is warm and dry.  Neurological:     General: No focal deficit present.     Mental Status: She is alert.    CBC    Component Value Date/Time   WBC 13.8 (H) 08/02/2022 1827   RBC 2.82 (L) 08/02/2022 1827   HGB 11.2 (L) 08/02/2022 1827   HCT 33.5 (L) 08/02/2022 1827   PLT 260 08/02/2022 1827   MCV 118.8 (H) 08/02/2022 1827   MCH 39.7 (H) 08/02/2022 1827   MCHC 33.4 08/02/2022 1827   RDW 13.2 08/02/2022 1827      Latest Ref Rng & Units 08/02/2022    6:27 PM  BMP  Glucose 70 - 99 mg/dL 960   BUN 8 - 23 mg/dL 13   Creatinine 4.54 -  1.00 mg/dL 0.98   Sodium 119 - 147 mmol/L 138   Potassium 3.5 - 5.1 mmol/L 3.8   Chloride 98 - 111 mmol/L 101   CO2 22 - 32 mmol/L 27   Calcium 8.9 - 10.3 mg/dL 8.5     Chest  imaging: CXR 08/02/22 Hazy airspace and interstitial opacities in the right lower lung suggestive of pneumonia. The left lung is clear. No pleural effusion or pneumothorax. Stable cardiomediastinal silhouette. No displaced rib fractures.  PFT:     No data to display          Labs:  Path:  Echo:  Heart Catheterization:  Assessment & Plan:   Moderate persistent asthma with acute exacerbation - Plan: montelukast (SINGULAIR) 10 MG tablet  Discussion: Adriana Oliver is a 72 year old woman, never smoker who is referred to pulmonary clinic for asthma.   She has acute exacerbation of her asthma due to pneumonia. She continues to have significant wheezing on exam. We will treat with extended prednisone taper.   She is to continue breztri inhaler 2 puffs twice daily and as needed albuterol inhale or nebulizer treatments.   She is to start montelukast 10mg  daily.   Follow up in 2 months, call sooner if needed.  Adriana Comas, MD [facility] Office: [phone]   Current Outpatient Medications:    acetaminophen (TYLENOL) 650 MG CR tablet, Take 650 mg by mouth every 8 (eight) hours as needed., Disp: , Rfl:    albuterol (PROAIR HFA) 108 (90 Base) MCG/ACT inhaler, Inhale 2 puffs into the lungs 4 (four) times daily as needed for wheezing or shortness of breath., Disp: 18 g, Rfl: 5   BREZTRI AEROSPHERE 160-9-4.8 MCG/ACT AERO, Inhale 2 puffs into the lungs 2 (two) times daily., Disp: , Rfl:    chlorpheniramine-HYDROcodone (TUSSIONEX) 10-8 MG/5ML, Take 5 mLs by mouth every 12 (twelve) hours as needed for cough., Disp: 70 mL, Rfl: 0   escitalopram (LEXAPRO) 10 MG tablet, Take 10 mg by mouth daily., Disp: , Rfl:    ezetimibe (ZETIA) 10 MG tablet, Take 10 mg by mouth daily., Disp: , Rfl:     levothyroxine (SYNTHROID) 50 MCG tablet, Take 560 mcg by mouth daily., Disp: , Rfl:    montelukast (SINGULAIR) 10 MG tablet, Take 1 tablet (10 mg total) by mouth at bedtime., Disp: 30 tablet, Rfl: 11   pravastatin (PRAVACHOL) 20 MG tablet, Take 20 mg by mouth daily., Disp: , Rfl:    predniSONE (DELTASONE) 10 MG tablet, Take 4 tabs by mouth for 3 days, then 3 for 3 days, 2 for 3 days, 1 for 3 days and stop, Disp: 30 tablet, Rfl: 0

## 2022-08-10 NOTE — Telephone Encounter (Signed)
Adriana Oliver husband is returning phone call. Adriana Oliver phone number is [phone].

## 2022-08-10 NOTE — Telephone Encounter (Signed)
Attempted to call pt's spouse Tasia Catchings but unable to reach. Left message for him to return call.

## 2022-08-11 NOTE — Telephone Encounter (Signed)
Called [place] but he did not answer. Left him a message that we received his message in regards to his wife's medications.   Will forward message to Dr. Francine Graven as a FYI.

## 2022-08-14 NOTE — Telephone Encounter (Signed)
Thanks

## 2022-08-16 ENCOUNTER — Institutional Professional Consult (permissible substitution): Payer: Medicare PPO | Admitting: Internal Medicine

## 2022-08-22 DIAGNOSIS — E039 Hypothyroidism, unspecified: Secondary | ICD-10-CM | POA: Diagnosis not present

## 2022-08-22 DIAGNOSIS — E669 Obesity, unspecified: Secondary | ICD-10-CM | POA: Diagnosis not present

## 2022-08-22 DIAGNOSIS — J45909 Unspecified asthma, uncomplicated: Secondary | ICD-10-CM | POA: Diagnosis not present

## 2022-08-22 DIAGNOSIS — E782 Mixed hyperlipidemia: Secondary | ICD-10-CM | POA: Diagnosis not present

## 2022-08-22 DIAGNOSIS — R7301 Impaired fasting glucose: Secondary | ICD-10-CM | POA: Diagnosis not present

## 2022-08-22 DIAGNOSIS — B359 Dermatophytosis, unspecified: Secondary | ICD-10-CM | POA: Diagnosis not present

## 2022-08-27 DIAGNOSIS — J449 Chronic obstructive pulmonary disease, unspecified: Secondary | ICD-10-CM | POA: Diagnosis not present

## 2022-08-31 ENCOUNTER — Telehealth: Payer: Self-pay | Admitting: Pulmonary Disease

## 2022-08-31 DIAGNOSIS — J189 Pneumonia, unspecified organism: Secondary | ICD-10-CM

## 2022-08-31 MED ORDER — AMOXICILLIN-POT CLAVULANATE 875-125 MG PO TABS
1.0000 | ORAL_TABLET | Freq: Two times a day (BID) | ORAL | 0 refills | Status: DC
Start: 2022-08-31 — End: 2022-10-25

## 2022-08-31 MED ORDER — PREDNISONE 10 MG PO TABS
ORAL_TABLET | ORAL | 0 refills | Status: AC
Start: 2022-08-31 — End: 2022-09-11

## 2022-08-31 NOTE — Telephone Encounter (Signed)
PT states she feels as though her pneumonia is returning. Please call to advise. No immediate appts avail. [phone]

## 2022-08-31 NOTE — Telephone Encounter (Signed)
I have sent in augmentin twice daily for 10 days, steroid taper and would like to schedule her for a CT Chest scan for further evaluation.  Thanks, JD

## 2022-08-31 NOTE — Telephone Encounter (Signed)
Primary Pulmonologist: Dewald Last office visit and with whom: 08/08/2022 What do we see them for (pulmonary problems): Asthma Last OV assessment/plan:  Assessment & Plan:    Moderate persistent asthma with acute exacerbation - Plan: montelukast (SINGULAIR) 10 MG tablet   Discussion: Adriana Oliver is a 72 year old woman, never smoker who is referred to pulmonary clinic for asthma.    She has acute exacerbation of her asthma due to pneumonia. She continues to have significant wheezing on exam. We will treat with extended prednisone taper.    She is to continue breztri inhaler 2 puffs twice daily and as needed albuterol inhale or nebulizer treatments.    She is to start montelukast 10mg  daily.    Follow up in 2 months, call sooner if needed.   Was appointment offered to patient (explain)?  No, none available   Reason for call: Cough, Chest tight.  Dry cough.  Does not cough up the mucous.  Has had body aches.  Head feels congested.  Symptoms came back yesterday (08/30/2022).  Took Mucinx DM yesterday.  Using nebulizer am and pm, gets relief.  Feels mucous rattling I chest, cough is not deep.  Coughing and wheezing at night.  Husband states he hears every breath.  Prednisone helped last time.  Breathing at baseline.  Feels fatigue.  Does not wear any oxygen.  She is using Breztri as prescribed and nebs as prescribed.   (examples of things to ask: : When did symptoms start? Fever? Cough? Productive? Color to sputum? More sputum than usual? Wheezing? Have you needed increased oxygen? Are you taking your respiratory medications? What over the counter measures have you tried?)  Allergies  Allergen Reactions   Nasal Spray Nausea And Vomiting   Oxycodone-Acetaminophen Nausea And Vomiting   Peg 3350-Kcl-Na Bicarb-Nacl Nausea And Vomiting    Immunization History  Administered Date(s) Administered   Fluad Quad(high Dose 65+) 01/07/2019   Influenza Split 03/02/2012   Influenza, High Dose Seasonal  PF 01/05/2015   Pneumococcal Conjugate-13 01/05/2015   Tdap 06/03/2010   Zoster, Live 08/18/2011

## 2022-09-01 NOTE — Telephone Encounter (Signed)
Left a detailed message on patient's voicemail.Per Dr.Dewald   I have sent in augmentin twice daily for 10 days, steroid taper and would like to schedule her for a CT Chest scan for further evaluation.   Nothing else further needed.

## 2022-09-07 ENCOUNTER — Ambulatory Visit (HOSPITAL_BASED_OUTPATIENT_CLINIC_OR_DEPARTMENT_OTHER)
Admission: RE | Admit: 2022-09-07 | Discharge: 2022-09-07 | Disposition: A | Discharge status: Home / Self Care | Payer: Medicare PPO | Source: Ambulatory Visit | Attending: Pulmonary Disease | Admitting: Pulmonary Disease

## 2022-09-07 DIAGNOSIS — J189 Pneumonia, unspecified organism: Secondary | ICD-10-CM

## 2022-09-11 ENCOUNTER — Telehealth: Payer: Self-pay | Admitting: Pulmonary Disease

## 2022-09-11 DIAGNOSIS — R053 Chronic cough: Secondary | ICD-10-CM

## 2022-09-11 NOTE — Telephone Encounter (Signed)
Pt states per her Mychart she's recvd her results and looks like she has pneuomia and wants to know what is her next steps.

## 2022-09-12 NOTE — Telephone Encounter (Signed)
Pt calling back to get her treatment plan based off her results of her CT

## 2022-09-13 MED ORDER — FAMOTIDINE 20 MG PO TABS
20.0000 mg | ORAL_TABLET | Freq: Every day | ORAL | 2 refills | Status: DC
Start: 2022-09-13 — End: 2023-06-04

## 2022-09-13 MED ORDER — IPRATROPIUM BROMIDE 0.03 % NA SOLN
2.0000 | Freq: Two times a day (BID) | NASAL | 12 refills | Status: AC
Start: 2022-09-13 — End: ?

## 2022-09-13 MED ORDER — FLUTICASONE PROPIONATE 50 MCG/ACT NA SUSP
1.0000 | Freq: Every day | NASAL | 2 refills | Status: DC
Start: 2022-09-13 — End: 2023-06-04

## 2022-09-13 NOTE — Telephone Encounter (Signed)
There is no official report on the CT Chest scan from radiology. Upon my personal review, there is now evidence of infectious pneumonia or other abnormalities that are leading to her cough.   She may have reflux disease or sinus drainage that could be leading to her cough.   Recommend she start fluticasone nasal spray, 1 spray per nostril daily and ipratropium nasal spray, 2 sprays per nostril twice daily.  She is to also start famotidine 20mg  at bed time.   Please schedule her for follow up with an APP in 1-2 weeks for follow up.   JD

## 2022-09-13 NOTE — Telephone Encounter (Signed)
Daughter calling now after several attempts for mom to get thru for results. PT still has pneumonia and is still not feeling better. Also, she is running out of antibx and will need more, she feels.   Pharm is [facility] on [address] [phone] is daughter,

## 2022-09-13 NOTE — Telephone Encounter (Signed)
I spoke to pt (her daughter is not listed as her DPR) pt stated she will soon be done with her amoxicillin-clavulanate (AUGMENTIN) 875-125 MG tablet and predniSONE (DELTASONE) 10 MG tablet and still has drainage but not coughing. She also stated she is going out of town and wanted antibiotics and prednisone just in case. I informed her that we haven't gotten the full CT scan report back yet. And I will send Dr Francine Graven a message to determine what to do about her course of treatment. Pt verbalized understanding.

## 2022-09-13 NOTE — Telephone Encounter (Signed)
I spoke to Adriana Oliver again and gave her Dr Lanora Manis recommendations. Adriana Oliver has an appt 10/25/22 with Dr Raquel James. Adriana Oliver verbalized understanding nothing further needed.

## 2022-09-20 DIAGNOSIS — E039 Hypothyroidism, unspecified: Secondary | ICD-10-CM | POA: Diagnosis not present

## 2022-09-20 DIAGNOSIS — D72829 Elevated white blood cell count, unspecified: Secondary | ICD-10-CM | POA: Diagnosis not present

## 2022-09-27 DIAGNOSIS — J449 Chronic obstructive pulmonary disease, unspecified: Secondary | ICD-10-CM | POA: Diagnosis not present

## 2022-10-25 ENCOUNTER — Ambulatory Visit: Payer: Medicare PPO | Admitting: Pulmonary Disease

## 2022-10-25 ENCOUNTER — Encounter: Payer: Self-pay | Admitting: Pulmonary Disease

## 2022-10-25 VITALS — BP 136/80 | HR 77 | Temp 98.2°F | Ht 61.75 in | Wt 187.8 lb

## 2022-10-25 DIAGNOSIS — J4540 Moderate persistent asthma, uncomplicated: Secondary | ICD-10-CM | POA: Diagnosis not present

## 2022-10-25 NOTE — Patient Instructions (Addendum)
Continue breztri 2 puffs twice daily - rinse mouth out after each use  Use albuterol inhaler as needed, 1-2 puffs every 4-6 hours as needed  Continue montelukast 10mg  daily  We will check labs today, CBC and IgE levels  Follow up in 6 months with pulmonary function tests

## 2022-10-25 NOTE — Progress Notes (Signed)
Synopsis: Referred in May 2024 for shortness of breath  Subjective:   PATIENT ID: Adriana Oliver GENDER: female DOB: Aug 08, 1950, MRN: [id number]  HPI  Chief Complaint  Patient presents with   Follow-up    Breathing is much improved. She still has some minimal wheezing. She is using her albuterol inhaler rarely.    Adriana Oliver is a 72 year old woman, never smoker who returns to pulmonary clinic for asthma.   Initial OV 08/08/22 She was seen by Dr. Celine Mans 04/09/2019 for moderate persistent asthma. She was continued on symbicort 160-4.42mcg 2 puffs twice daily at that time.   She was treated for community acquired pneumonia with amoxicillin and azithromycin after an ER visit 08/02/22. Chest x-ray showed RLL opacity. She continues to have significant cough. She has been coughing up mucous, green to clear now. She is having mild night sweats, but no fevers are chills. She is having wheezing and shortness of breath.   Prior to her infection, her breathing was up and down.   She is a never smoker. Lives with her husband.   Today 10/25/22 She has been feeling better since last visit but is having some extra chest tightness today.  She continues to use breztri 2 puffs twice daily    Past Medical History:  Diagnosis Date   COPD (chronic obstructive pulmonary disease) (HCC)    DOE (dyspnea on exertion)    Hyperlipidemia    Hypothyroidism    Incontinence    Obesity    Osteopenia    Prediabetes      Family History  Problem Relation Age of Onset   Emphysema Mother        deceased   COPD Mother    Diabetes Sister    Arthritis Sister    Diabetes Sister      Social History   Socioeconomic History   Marital status: Married    Spouse name: Not on file   Number of children: 4   Years of education: Not on file   Highest education level: Not on file  Occupational History   Occupation: Runner, broadcasting/film/video  Tobacco Use   Smoking status: Never   Smokeless tobacco: Never  Vaping Use   Vaping  status: Never Used  Substance and Sexual Activity   Alcohol use: No   Drug use: No   Sexual activity: Not on file  Other Topics Concern   Not on file  Social History Narrative   Not on file   Social Determinants of Health   Financial Resource Strain: Not on file  Food Insecurity: Not on file  Transportation Needs: Not on file  Physical Activity: Not on file  Stress: Not on file  Social Connections: Not on file  Intimate Partner Violence: Not on file     Allergies  Allergen Reactions   Nasal Spray Nausea And Vomiting   Oxycodone-Acetaminophen Nausea And Vomiting   Peg 3350-Kcl-Na Bicarb-Nacl Nausea And Vomiting     Outpatient Medications Prior to Visit  Medication Sig Dispense Refill   acetaminophen (TYLENOL) 650 MG CR tablet Take 650 mg by mouth every 8 (eight) hours as needed.     albuterol (PROAIR HFA) 108 (90 Base) MCG/ACT inhaler Inhale 2 puffs into the lungs 4 (four) times daily as needed for wheezing or shortness of breath. 18 g 5   BREZTRI AEROSPHERE 160-9-4.8 MCG/ACT AERO Inhale 2 puffs into the lungs 2 (two) times daily.     chlorpheniramine-HYDROcodone (TUSSIONEX) 10-8 MG/5ML Take 5 mLs by mouth  every 12 (twelve) hours as needed for cough. 70 mL 0   escitalopram (LEXAPRO) 10 MG tablet Take 10 mg by mouth daily.     ezetimibe (ZETIA) 10 MG tablet Take 10 mg by mouth daily.     famotidine (PEPCID) 20 MG tablet Take 1 tablet (20 mg total) by mouth at bedtime. 30 tablet 2   fluticasone (FLONASE) 50 MCG/ACT nasal spray Place 1 spray into both nostrils daily. 16 g 2   ipratropium (ATROVENT) 0.03 % nasal spray Place 2 sprays into both nostrils every 12 (twelve) hours. 30 mL 12   levothyroxine (SYNTHROID) 75 MCG tablet Take 75 mcg by mouth every morning.     montelukast (SINGULAIR) 10 MG tablet Take 1 tablet (10 mg total) by mouth at bedtime. 30 tablet 11   pravastatin (PRAVACHOL) 20 MG tablet Take 20 mg by mouth daily.     amoxicillin-clavulanate (AUGMENTIN) 875-125 MG  tablet Take 1 tablet by mouth 2 (two) times daily. 20 tablet 0   levothyroxine (SYNTHROID) 50 MCG tablet Take 560 mcg by mouth daily.     No facility-administered medications prior to visit.   Review of Systems  Constitutional:  Negative for chills, fever, malaise/fatigue and weight loss.  HENT:  Negative for congestion, sinus pain and sore throat.   Eyes: Negative.   Respiratory:  Positive for shortness of breath. Negative for cough, hemoptysis, sputum production and wheezing.   Cardiovascular:  Negative for chest pain, palpitations, orthopnea, claudication and leg swelling.  Gastrointestinal:  Negative for abdominal pain, heartburn, nausea and vomiting.  Genitourinary: Negative.   Musculoskeletal:  Negative for joint pain and myalgias.  Skin:  Negative for rash.  Neurological:  Negative for weakness.  Endo/Heme/Allergies: Negative.   Psychiatric/Behavioral: Negative.     Objective:   Vitals:   10/25/22 1441  BP: 136/80  Pulse: 77  Temp: 98.2 F (36.8 C)  TempSrc: Oral  SpO2: 93%  Weight: 187 lb 12.8 oz (85.2 kg)  Height: 5' 1.75" (1.568 m)    Physical Exam Constitutional:      General: She is not in acute distress.    Appearance: She is not ill-appearing.  HENT:     Head: Normocephalic and atraumatic.  Eyes:     General: No scleral icterus.    Conjunctiva/sclera: Conjunctivae normal.  Cardiovascular:     Rate and Rhythm: Normal rate and regular rhythm.     Pulses: Normal pulses.     Heart sounds: Normal heart sounds. No murmur heard. Pulmonary:     Effort: Pulmonary effort is normal.     Breath sounds: No wheezing, rhonchi or rales.  Musculoskeletal:     Right lower leg: No edema.     Left lower leg: No edema.  Lymphadenopathy:     Cervical: No cervical adenopathy.  Skin:    General: Skin is warm and dry.  Neurological:     General: No focal deficit present.     Mental Status: She is alert.    CBC    Component Value Date/Time   WBC 7.9 10/25/2022 1525    RBC 3.99 10/25/2022 1525   HGB 13.5 10/25/2022 1525   HCT 41.2 10/25/2022 1525   PLT 264.0 10/25/2022 1525   MCV 103.2 (H) 10/25/2022 1525   MCH 39.7 (H) 08/02/2022 1827   MCHC 32.9 10/25/2022 1525   RDW 14.1 10/25/2022 1525   LYMPHSABS 1.7 10/25/2022 1525   MONOABS 0.6 10/25/2022 1525   EOSABS 0.3 10/25/2022 1525   BASOSABS 0.1 10/25/2022  1525      Latest Ref Rng & Units 08/02/2022    6:27 PM  BMP  Glucose 70 - 99 mg/dL 366   BUN 8 - 23 mg/dL 13   Creatinine 4.40 - 1.00 mg/dL 3.47   Sodium 425 - 956 mmol/L 138   Potassium 3.5 - 5.1 mmol/L 3.8   Chloride 98 - 111 mmol/L 101   CO2 22 - 32 mmol/L 27   Calcium 8.9 - 10.3 mg/dL 8.5     Chest imaging: CT Chest 09/07/22 Mediastinum/Nodes: There are no enlarged mediastinal, hilar or axillary lymph nodes.There are scattered small mediastinal lymph nodes. Hilar assessment is limited by the lack of intravenous contrast. The thyroid gland, trachea and esophagus demonstrate no significant findings.   Lungs/Pleura: No pleural effusion or pneumothorax. Minimal linear atelectasis or scarring at both lung bases. The right basilar airspace disease seen on prior radiographs has resolved. There is a small benign calcified right upper lobe granuloma. No suspicious pulmonary nodules.  CXR 08/02/22 Hazy airspace and interstitial opacities in the right lower lung suggestive of pneumonia. The left lung is clear. No pleural effusion or pneumothorax. Stable cardiomediastinal silhouette. No displaced rib fractures.  PFT:     No data to display         Labs:  Path:  Echo:  Heart Catheterization:  Assessment & Plan:   Moderate persistent asthma without complication - Plan: Pulmonary Function Test, CBC with Differential/Platelet, IgE, IgE, CBC with Differential/Platelet  Discussion: Adriana Oliver is a 72 year old woman, never smoker who returns to pulmonary clinic for asthma.   She is to continue breztri inhaler 2 puffs twice daily  and as needed albuterol inhale or nebulizer treatments.   Continue montelukast 10mg  daily.   Check CBC and IgE levels  Follow up in 6 months   Melody Comas, MD [facility] Office: [phone]    Current Outpatient Medications:    acetaminophen (TYLENOL) 650 MG CR tablet, Take 650 mg by mouth every 8 (eight) hours as needed., Disp: , Rfl:    albuterol (PROAIR HFA) 108 (90 Base) MCG/ACT inhaler, Inhale 2 puffs into the lungs 4 (four) times daily as needed for wheezing or shortness of breath., Disp: 18 g, Rfl: 5   BREZTRI AEROSPHERE 160-9-4.8 MCG/ACT AERO, Inhale 2 puffs into the lungs 2 (two) times daily., Disp: , Rfl:    chlorpheniramine-HYDROcodone (TUSSIONEX) 10-8 MG/5ML, Take 5 mLs by mouth every 12 (twelve) hours as needed for cough., Disp: 70 mL, Rfl: 0   escitalopram (LEXAPRO) 10 MG tablet, Take 10 mg by mouth daily., Disp: , Rfl:    ezetimibe (ZETIA) 10 MG tablet, Take 10 mg by mouth daily., Disp: , Rfl:    famotidine (PEPCID) 20 MG tablet, Take 1 tablet (20 mg total) by mouth at bedtime., Disp: 30 tablet, Rfl: 2   fluticasone (FLONASE) 50 MCG/ACT nasal spray, Place 1 spray into both nostrils daily., Disp: 16 g, Rfl: 2   ipratropium (ATROVENT) 0.03 % nasal spray, Place 2 sprays into both nostrils every 12 (twelve) hours., Disp: 30 mL, Rfl: 12   levothyroxine (SYNTHROID) 75 MCG tablet, Take 75 mcg by mouth every morning., Disp: , Rfl:    montelukast (SINGULAIR) 10 MG tablet, Take 1 tablet (10 mg total) by mouth at bedtime., Disp: 30 tablet, Rfl: 11   pravastatin (PRAVACHOL) 20 MG tablet, Take 20 mg by mouth daily., Disp: , Rfl:

## 2022-10-26 LAB — CBC WITH DIFFERENTIAL/PLATELET
Basophils Absolute: 0.1 10*3/uL (ref 0.0–0.1)
Basophils Relative: 0.8 % (ref 0.0–3.0)
Eosinophils Absolute: 0.3 10*3/uL (ref 0.0–0.7)
Eosinophils Relative: 3.6 % (ref 0.0–5.0)
HCT: 41.2 % (ref 36.0–46.0)
Lymphs Abs: 1.7 10*3/uL (ref 0.7–4.0)
MCHC: 32.9 g/dL (ref 30.0–36.0)
MCV: 103.2 fl — ABNORMAL HIGH (ref 78.0–100.0)
Monocytes Absolute: 0.6 10*3/uL (ref 0.1–1.0)
Monocytes Relative: 7.3 % (ref 3.0–12.0)
Neutro Abs: 5.3 10*3/uL (ref 1.4–7.7)
Neutrophils Relative %: 67.1 % (ref 43.0–77.0)
Platelets: 264 10*3/uL (ref 150.0–400.0)
RBC: 3.99 Mil/uL (ref 3.87–5.11)
RDW: 14.1 % (ref 11.5–15.5)
WBC: 7.9 10*3/uL (ref 4.0–10.5)

## 2022-10-26 LAB — IGE: IgE (Immunoglobulin E), Serum: 12 kU/L (ref <=114)

## 2022-10-27 DIAGNOSIS — J449 Chronic obstructive pulmonary disease, unspecified: Secondary | ICD-10-CM | POA: Diagnosis not present

## 2022-11-03 DIAGNOSIS — E039 Hypothyroidism, unspecified: Secondary | ICD-10-CM | POA: Diagnosis not present

## 2022-11-05 ENCOUNTER — Encounter: Payer: Self-pay | Admitting: Pulmonary Disease

## 2022-11-27 DIAGNOSIS — J449 Chronic obstructive pulmonary disease, unspecified: Secondary | ICD-10-CM | POA: Diagnosis not present

## 2022-12-16 DIAGNOSIS — Z23 Encounter for immunization: Secondary | ICD-10-CM | POA: Diagnosis not present

## 2022-12-28 DIAGNOSIS — J449 Chronic obstructive pulmonary disease, unspecified: Secondary | ICD-10-CM | POA: Diagnosis not present

## 2023-01-01 DIAGNOSIS — J441 Chronic obstructive pulmonary disease with (acute) exacerbation: Secondary | ICD-10-CM | POA: Diagnosis not present

## 2023-01-01 DIAGNOSIS — J22 Unspecified acute lower respiratory infection: Secondary | ICD-10-CM | POA: Diagnosis not present

## 2023-01-26 DIAGNOSIS — R52 Pain, unspecified: Secondary | ICD-10-CM | POA: Diagnosis not present

## 2023-01-26 DIAGNOSIS — Z8261 Family history of arthritis: Secondary | ICD-10-CM | POA: Diagnosis not present

## 2023-01-26 DIAGNOSIS — J449 Chronic obstructive pulmonary disease, unspecified: Secondary | ICD-10-CM | POA: Diagnosis not present

## 2023-01-26 DIAGNOSIS — E669 Obesity, unspecified: Secondary | ICD-10-CM | POA: Diagnosis not present

## 2023-01-26 DIAGNOSIS — Z6834 Body mass index (BMI) 34.0-34.9, adult: Secondary | ICD-10-CM | POA: Diagnosis not present

## 2023-01-26 DIAGNOSIS — M2550 Pain in unspecified joint: Secondary | ICD-10-CM | POA: Diagnosis not present

## 2023-01-27 DIAGNOSIS — J449 Chronic obstructive pulmonary disease, unspecified: Secondary | ICD-10-CM | POA: Diagnosis not present

## 2023-02-08 DIAGNOSIS — M2540 Effusion, unspecified joint: Secondary | ICD-10-CM | POA: Diagnosis not present

## 2023-02-08 DIAGNOSIS — M79641 Pain in right hand: Secondary | ICD-10-CM | POA: Diagnosis not present

## 2023-02-08 DIAGNOSIS — M79642 Pain in left hand: Secondary | ICD-10-CM | POA: Diagnosis not present

## 2023-02-08 DIAGNOSIS — M2560 Stiffness of unspecified joint, not elsewhere classified: Secondary | ICD-10-CM | POA: Diagnosis not present

## 2023-02-08 DIAGNOSIS — Z6834 Body mass index (BMI) 34.0-34.9, adult: Secondary | ICD-10-CM | POA: Diagnosis not present

## 2023-02-08 DIAGNOSIS — E669 Obesity, unspecified: Secondary | ICD-10-CM | POA: Diagnosis not present

## 2023-02-08 DIAGNOSIS — R7982 Elevated C-reactive protein (CRP): Secondary | ICD-10-CM | POA: Diagnosis not present

## 2023-02-08 DIAGNOSIS — R700 Elevated erythrocyte sedimentation rate: Secondary | ICD-10-CM | POA: Diagnosis not present

## 2023-02-14 DIAGNOSIS — M79641 Pain in right hand: Secondary | ICD-10-CM | POA: Diagnosis not present

## 2023-02-14 DIAGNOSIS — Z6833 Body mass index (BMI) 33.0-33.9, adult: Secondary | ICD-10-CM | POA: Diagnosis not present

## 2023-02-14 DIAGNOSIS — M2560 Stiffness of unspecified joint, not elsewhere classified: Secondary | ICD-10-CM | POA: Diagnosis not present

## 2023-02-14 DIAGNOSIS — R7982 Elevated C-reactive protein (CRP): Secondary | ICD-10-CM | POA: Diagnosis not present

## 2023-02-14 DIAGNOSIS — M79642 Pain in left hand: Secondary | ICD-10-CM | POA: Diagnosis not present

## 2023-02-14 DIAGNOSIS — E669 Obesity, unspecified: Secondary | ICD-10-CM | POA: Diagnosis not present

## 2023-02-14 DIAGNOSIS — M2540 Effusion, unspecified joint: Secondary | ICD-10-CM | POA: Diagnosis not present

## 2023-02-14 DIAGNOSIS — R700 Elevated erythrocyte sedimentation rate: Secondary | ICD-10-CM | POA: Diagnosis not present

## 2023-02-27 DIAGNOSIS — M2540 Effusion, unspecified joint: Secondary | ICD-10-CM | POA: Diagnosis not present

## 2023-02-27 DIAGNOSIS — M79641 Pain in right hand: Secondary | ICD-10-CM | POA: Diagnosis not present

## 2023-02-27 DIAGNOSIS — M2560 Stiffness of unspecified joint, not elsewhere classified: Secondary | ICD-10-CM | POA: Diagnosis not present

## 2023-02-27 DIAGNOSIS — J449 Chronic obstructive pulmonary disease, unspecified: Secondary | ICD-10-CM | POA: Diagnosis not present

## 2023-02-27 DIAGNOSIS — M0609 Rheumatoid arthritis without rheumatoid factor, multiple sites: Secondary | ICD-10-CM | POA: Diagnosis not present

## 2023-03-23 DIAGNOSIS — M064 Inflammatory polyarthropathy: Secondary | ICD-10-CM | POA: Diagnosis not present

## 2023-03-23 DIAGNOSIS — M899 Disorder of bone, unspecified: Secondary | ICD-10-CM | POA: Diagnosis not present

## 2023-03-23 DIAGNOSIS — R7301 Impaired fasting glucose: Secondary | ICD-10-CM | POA: Diagnosis not present

## 2023-03-23 DIAGNOSIS — N393 Stress incontinence (female) (male): Secondary | ICD-10-CM | POA: Diagnosis not present

## 2023-03-23 DIAGNOSIS — J45909 Unspecified asthma, uncomplicated: Secondary | ICD-10-CM | POA: Diagnosis not present

## 2023-03-23 DIAGNOSIS — Z9181 History of falling: Secondary | ICD-10-CM | POA: Diagnosis not present

## 2023-03-23 DIAGNOSIS — Z0000 Encounter for general adult medical examination without abnormal findings: Secondary | ICD-10-CM | POA: Diagnosis not present

## 2023-03-23 DIAGNOSIS — E039 Hypothyroidism, unspecified: Secondary | ICD-10-CM | POA: Diagnosis not present

## 2023-03-23 DIAGNOSIS — E782 Mixed hyperlipidemia: Secondary | ICD-10-CM | POA: Diagnosis not present

## 2023-03-23 DIAGNOSIS — N3281 Overactive bladder: Secondary | ICD-10-CM | POA: Diagnosis not present

## 2023-03-29 DIAGNOSIS — L509 Urticaria, unspecified: Secondary | ICD-10-CM | POA: Diagnosis not present

## 2023-03-29 DIAGNOSIS — J449 Chronic obstructive pulmonary disease, unspecified: Secondary | ICD-10-CM | POA: Diagnosis not present

## 2023-04-03 DIAGNOSIS — R051 Acute cough: Secondary | ICD-10-CM | POA: Diagnosis not present

## 2023-04-03 DIAGNOSIS — R509 Fever, unspecified: Secondary | ICD-10-CM | POA: Diagnosis not present

## 2023-04-03 DIAGNOSIS — U071 COVID-19: Secondary | ICD-10-CM | POA: Diagnosis not present

## 2023-04-03 DIAGNOSIS — L509 Urticaria, unspecified: Secondary | ICD-10-CM | POA: Diagnosis not present

## 2023-04-03 DIAGNOSIS — J988 Other specified respiratory disorders: Secondary | ICD-10-CM | POA: Diagnosis not present

## 2023-04-03 DIAGNOSIS — J449 Chronic obstructive pulmonary disease, unspecified: Secondary | ICD-10-CM | POA: Diagnosis not present

## 2023-04-20 DIAGNOSIS — B353 Tinea pedis: Secondary | ICD-10-CM | POA: Diagnosis not present

## 2023-04-20 DIAGNOSIS — B370 Candidal stomatitis: Secondary | ICD-10-CM | POA: Diagnosis not present

## 2023-04-29 DIAGNOSIS — J449 Chronic obstructive pulmonary disease, unspecified: Secondary | ICD-10-CM | POA: Diagnosis not present

## 2023-05-09 DIAGNOSIS — R700 Elevated erythrocyte sedimentation rate: Secondary | ICD-10-CM | POA: Diagnosis not present

## 2023-05-09 DIAGNOSIS — M79642 Pain in left hand: Secondary | ICD-10-CM | POA: Diagnosis not present

## 2023-05-09 DIAGNOSIS — M0609 Rheumatoid arthritis without rheumatoid factor, multiple sites: Secondary | ICD-10-CM | POA: Diagnosis not present

## 2023-05-09 DIAGNOSIS — M2560 Stiffness of unspecified joint, not elsewhere classified: Secondary | ICD-10-CM | POA: Diagnosis not present

## 2023-05-09 DIAGNOSIS — R7982 Elevated C-reactive protein (CRP): Secondary | ICD-10-CM | POA: Diagnosis not present

## 2023-05-09 DIAGNOSIS — M79641 Pain in right hand: Secondary | ICD-10-CM | POA: Diagnosis not present

## 2023-05-09 DIAGNOSIS — Z6836 Body mass index (BMI) 36.0-36.9, adult: Secondary | ICD-10-CM | POA: Diagnosis not present

## 2023-05-09 DIAGNOSIS — E669 Obesity, unspecified: Secondary | ICD-10-CM | POA: Diagnosis not present

## 2023-05-09 DIAGNOSIS — M2540 Effusion, unspecified joint: Secondary | ICD-10-CM | POA: Diagnosis not present

## 2023-05-30 DIAGNOSIS — J449 Chronic obstructive pulmonary disease, unspecified: Secondary | ICD-10-CM | POA: Diagnosis not present

## 2023-06-04 ENCOUNTER — Encounter: Payer: Self-pay | Admitting: Primary Care

## 2023-06-04 ENCOUNTER — Ambulatory Visit: Payer: Medicare PPO | Admitting: Primary Care

## 2023-06-04 DIAGNOSIS — K219 Gastro-esophageal reflux disease without esophagitis: Secondary | ICD-10-CM | POA: Diagnosis not present

## 2023-06-04 DIAGNOSIS — J4541 Moderate persistent asthma with (acute) exacerbation: Secondary | ICD-10-CM | POA: Diagnosis not present

## 2023-06-04 DIAGNOSIS — R053 Chronic cough: Secondary | ICD-10-CM | POA: Diagnosis not present

## 2023-06-04 MED ORDER — FAMOTIDINE 20 MG PO TABS
20.0000 mg | ORAL_TABLET | Freq: Every day | ORAL | 5 refills | Status: DC
Start: 2023-06-04 — End: 2023-11-27

## 2023-06-04 MED ORDER — PREDNISONE 10 MG PO TABS: ORAL_TABLET | ORAL | 0 refills | Status: DC

## 2023-06-04 MED ORDER — MONTELUKAST SODIUM 10 MG PO TABS
10.0000 mg | ORAL_TABLET | Freq: Every day | ORAL | 11 refills | Status: AC
Start: 2023-06-04 — End: ?

## 2023-06-04 MED ORDER — FLUTICASONE PROPIONATE 50 MCG/ACT NA SUSP
1.0000 | Freq: Every day | NASAL | 2 refills | Status: AC
Start: 2023-06-04 — End: ?

## 2023-06-04 MED ORDER — BREZTRI AEROSPHERE 160-9-4.8 MCG/ACT IN AERO: 2.0000 | INHALATION_SPRAY | Freq: Two times a day (BID) | RESPIRATORY_TRACT | 11 refills | Status: AC

## 2023-06-04 NOTE — Progress Notes (Signed)
 @Patient  ID: Adriana Oliver, female    DOB: February 12, 1951, 73 y.o.   MRN: [id number]  Chief Complaint  Patient presents with   Follow-up    Referring provider: Lupita Raider, MD  HPI: 73 year old female, never smoked.  Past medical history significant for moderate persistent asthma, allergic rhinitis, hyperlipidemia, obesity.   06/04/2023 Discussed the use of AI scribe software for clinical note transcription with the patient, who gave verbal consent to proceed.  Patient presents today for acute office visit.  Patient is followed by Dr. Francine Graven for history of moderate persistent asthma, last seen in office 10/25/2022.  Patient is maintained on [employer], Singulair and Ventolin HFA.  Function testing was ordered back in July 2024 however not completed. IGE 12, eosinophil absolute 300.  She has moderate persistent asthma, treated with a Breztri inhaler, which she ran out of approximately one week ago. This led to a flare-up of her symptoms starting last Thursday or Friday. She feels 'worn out' and describes having to 'work to breathe,' which is not typical for her. She also experiences chest tightness upon waking up.  She has been using Zyrtec since the past weekend to manage symptoms, including eye irritation and redness, likely exacerbated by recent weather changes and pollen. She is also taking Singulair at bedtime and needs a refill. She uses Flonase nasal spray and [place] an additional [place]  Her asthma symptoms impact her daily life, including her ability to sleep comfortably. She cannot lie flat without feeling like she is 'drowning,' affecting her sleep and relationship with her husband. She and her husband have adjustable beds to help with this issue.  She has not smoked in the past and is currently managing her asthma with Breztri (yellow inhaler for morning and evening use) and a red rescue inhaler for acute symptoms, which can make her  jittery.      Allergies  Allergen Reactions   Nasal Spray Nausea And Vomiting   Oxycodone-Acetaminophen Nausea And Vomiting   Peg 3350-Kcl-Na Bicarb-Nacl Nausea And Vomiting    Immunization History  Administered Date(s) Administered   Fluad Quad(high Dose 65+) 01/07/2019   Influenza Split 03/02/2012   Influenza, High Dose Seasonal PF 01/05/2015   Pneumococcal Conjugate-13 01/05/2015   Tdap 06/03/2010   Zoster, Live 08/18/2011    Past Medical History:  Diagnosis Date   COPD (chronic obstructive pulmonary disease) (HCC)    DOE (dyspnea on exertion)    Hyperlipidemia    Hypothyroidism    Incontinence    Obesity    Osteopenia    Prediabetes     Tobacco History: Social History   Tobacco Use  Smoking Status Never  Smokeless Tobacco Never   Counseling given: Not Answered   Outpatient Medications Prior to Visit  Medication Sig Dispense Refill   acetaminophen (TYLENOL) 650 MG CR tablet Take 650 mg by mouth every 8 (eight) hours as needed.     albuterol (PROAIR HFA) 108 (90 Base) MCG/ACT inhaler Inhale 2 puffs into the lungs 4 (four) times daily as needed for wheezing or shortness of breath. 18 g 5   BREZTRI AEROSPHERE 160-9-4.8 MCG/ACT AERO Inhale 2 puffs into the lungs 2 (two) times daily.     chlorpheniramine-HYDROcodone (TUSSIONEX) 10-8 MG/5ML Take 5 mLs by mouth every 12 (twelve) hours as needed for cough. 70 mL 0   escitalopram (LEXAPRO) 10 MG tablet Take 10 mg by mouth daily.     ezetimibe (ZETIA) 10 MG tablet Take 10  mg by mouth daily.     famotidine (PEPCID) 20 MG tablet Take 1 tablet (20 mg total) by mouth at bedtime. 30 tablet 2   fluticasone (FLONASE) 50 MCG/ACT nasal spray Place 1 spray into both nostrils daily. 16 g 2   ipratropium (ATROVENT) 0.03 % nasal spray Place 2 sprays into both nostrils every 12 (twelve) hours. 30 mL 12   levothyroxine (SYNTHROID) 75 MCG tablet Take 75 mcg by mouth every morning.     montelukast (SINGULAIR) 10 MG tablet Take 1 tablet  (10 mg total) by mouth at bedtime. 30 tablet 11   pravastatin (PRAVACHOL) 20 MG tablet Take 20 mg by mouth daily.     No facility-administered medications prior to visit.   Review of Systems  Review of Systems  Constitutional: Negative.   HENT:  Positive for postnasal drip.   Respiratory:  Positive for chest tightness and shortness of breath.   Cardiovascular: Negative.    Physical Exam  BP 126/72 (BP Location: Left Arm, Patient Position: Sitting, Cuff Size: Normal)   Pulse (!) 102   Temp 97.6 F (36.4 C) (Temporal)   Ht 5\' 2"  (1.575 m)   Wt 199 lb 3.2 oz (90.4 kg)   SpO2 93%   BMI 36.43 kg/m  Physical Exam Constitutional:      General: She is not in acute distress.    Appearance: Normal appearance. She is not ill-appearing.  HENT:     Head: Normocephalic and atraumatic.     Mouth/Throat:     Mouth: Mucous membranes are moist.     Pharynx: Oropharynx is clear.  Cardiovascular:     Rate and Rhythm: Normal rate and regular rhythm.  Pulmonary:     Effort: Pulmonary effort is normal.     Breath sounds: Normal breath sounds. No wheezing, rhonchi or rales.  Musculoskeletal:        General: Normal range of motion.  Skin:    General: Skin is warm and dry.  Neurological:     General: No focal deficit present.     Mental Status: She is alert and oriented to person, place, and time. Mental status is at baseline.  Psychiatric:        Mood and Affect: Mood normal.        Behavior: Behavior normal.        Thought Content: Thought content normal.        Judgment: Judgment normal.      Lab Results:  CBC    Component Value Date/Time   WBC 7.9 10/25/2022 1525   RBC 3.99 10/25/2022 1525   HGB 13.5 10/25/2022 1525   HCT 41.2 10/25/2022 1525   PLT 264.0 10/25/2022 1525   MCV 103.2 (H) 10/25/2022 1525   MCH 39.7 (H) 08/02/2022 1827   MCHC 32.9 10/25/2022 1525   RDW 14.1 10/25/2022 1525   LYMPHSABS 1.7 10/25/2022 1525   MONOABS 0.6 10/25/2022 1525   EOSABS 0.3 10/25/2022  1525   BASOSABS 0.1 10/25/2022 1525    BMET    Component Value Date/Time   NA 138 08/02/2022 1827   K 3.8 08/02/2022 1827   CL 101 08/02/2022 1827   CO2 27 08/02/2022 1827   GLUCOSE 125 (H) 08/02/2022 1827   BUN 13 08/02/2022 1827   CREATININE 0.65 08/02/2022 1827   CALCIUM 8.5 (L) 08/02/2022 1827   GFRNONAA >60 08/02/2022 1827    BNP No results found for: "BNP"  ProBNP No results found for: "PROBNP"  Imaging: No results found.  Assessment & Plan:   1. Chronic cough - famotidine (PEPCID) 20 MG tablet; Take 1 tablet (20 mg total) by mouth at bedtime.  Dispense: 30 tablet; Refill: 5 - fluticasone (FLONASE) 50 MCG/ACT nasal spray; Place 1 spray into both nostrils daily.  Dispense: 16 g; Refill: 2  2. Moderate persistent asthma with acute exacerbation - montelukast (SINGULAIR) 10 MG tablet; Take 1 tablet (10 mg total) by mouth at bedtime.  Dispense: 30 tablet; Refill: 11  Moderate Persistent Asthma Exacerbation of asthma symptoms after running out of Breztri inhaler last week. Symptoms include chest tightness, difficulty breathing, and fatigue. Patient also has allergic symptoms and has restarted Zyrtec. -Resume Breztri Aerosphere inhaler twice daily -Start a course of prednisone:4 tabs for 2 days, 3 tabs for 2 days, 2 tabs for 2 days, 1 tab for 2 days -Continue Singulair at bedtime -Consider sleep study for possible sleep apnea if symptoms persist  Allergic Rhinitis Redness in eyes and nose, recently restarted Zyrtec and requested Flonase refill. -Continue Zyrtec daily. -Refill Flonase nasal spray, use as needed.  GERD -Start Famotidine at bedtime for possible reflux contributing to symptoms.  General Health Maintenance -Refill Singulair for a year. -Refill Flonase nasal spray. -Send in prescription for Famotidine.      Glenford Bayley, NP 06/04/2023

## 2023-06-04 NOTE — Patient Instructions (Addendum)
 -  MODERATE PERSISTENT ASTHMA: Moderate persistent asthma is a long-term condition where the airways in your lungs are inflamed and narrowed, making it hard to breathe. To manage this, resume using your Breztri inhaler twice daily. We are starting you on a course of prednisone to reduce inflammation: take 4 tablets for 2 days, 3 tablets for 2 days, 2 tablets for 2 days, and 1 tablet for 2 days. Continue taking Zyrtec daily and Singulair at bedtime. We are also starting Famotidine at bedtime to address possible reflux that might be contributing to your symptoms. If your symptoms persist, we may consider a sleep study to check for sleep apnea.  -ALLERGIC RHINITIS: Allergic rhinitis is an allergic reaction that causes sneezing, congestion, and eye irritation. Continue taking Zyrtec daily and use Flonase nasal spray as needed. We have refilled your Flonase prescription.  -GENERAL HEALTH MAINTENANCE: We have refilled your Singulair prescription for a year and sent in a prescription for Famotidine. Continue with your current medications and lifestyle adjustments to manage your symptoms effectively.  INSTRUCTIONS:  Please follow the medication plan as discussed. If your symptoms do not improve or worsen, schedule a follow-up appointment. Consider a sleep study if breathing issues during sleep persist.

## 2023-06-07 DIAGNOSIS — Z1231 Encounter for screening mammogram for malignant neoplasm of breast: Secondary | ICD-10-CM | POA: Diagnosis not present

## 2023-06-27 DIAGNOSIS — X32XXXA Exposure to sunlight, initial encounter: Secondary | ICD-10-CM | POA: Diagnosis not present

## 2023-06-27 DIAGNOSIS — L728 Other follicular cysts of the skin and subcutaneous tissue: Secondary | ICD-10-CM | POA: Diagnosis not present

## 2023-06-27 DIAGNOSIS — J449 Chronic obstructive pulmonary disease, unspecified: Secondary | ICD-10-CM | POA: Diagnosis not present

## 2023-06-27 DIAGNOSIS — L570 Actinic keratosis: Secondary | ICD-10-CM | POA: Diagnosis not present

## 2023-07-28 DIAGNOSIS — J449 Chronic obstructive pulmonary disease, unspecified: Secondary | ICD-10-CM | POA: Diagnosis not present

## 2023-08-15 DIAGNOSIS — M2560 Stiffness of unspecified joint, not elsewhere classified: Secondary | ICD-10-CM | POA: Diagnosis not present

## 2023-08-15 DIAGNOSIS — M79642 Pain in left hand: Secondary | ICD-10-CM | POA: Diagnosis not present

## 2023-08-15 DIAGNOSIS — M0609 Rheumatoid arthritis without rheumatoid factor, multiple sites: Secondary | ICD-10-CM | POA: Diagnosis not present

## 2023-08-15 DIAGNOSIS — Z6837 Body mass index (BMI) 37.0-37.9, adult: Secondary | ICD-10-CM | POA: Diagnosis not present

## 2023-08-15 DIAGNOSIS — M2540 Effusion, unspecified joint: Secondary | ICD-10-CM | POA: Diagnosis not present

## 2023-08-15 DIAGNOSIS — M79641 Pain in right hand: Secondary | ICD-10-CM | POA: Diagnosis not present

## 2023-08-15 DIAGNOSIS — E669 Obesity, unspecified: Secondary | ICD-10-CM | POA: Diagnosis not present

## 2023-09-21 DIAGNOSIS — R7301 Impaired fasting glucose: Secondary | ICD-10-CM | POA: Diagnosis not present

## 2023-09-21 DIAGNOSIS — E039 Hypothyroidism, unspecified: Secondary | ICD-10-CM | POA: Diagnosis not present

## 2023-09-21 DIAGNOSIS — J45909 Unspecified asthma, uncomplicated: Secondary | ICD-10-CM | POA: Diagnosis not present

## 2023-09-21 DIAGNOSIS — E782 Mixed hyperlipidemia: Secondary | ICD-10-CM | POA: Diagnosis not present

## 2023-09-21 DIAGNOSIS — R41840 Attention and concentration deficit: Secondary | ICD-10-CM | POA: Diagnosis not present

## 2023-09-21 DIAGNOSIS — M0520 Rheumatoid vasculitis with rheumatoid arthritis of unspecified site: Secondary | ICD-10-CM | POA: Diagnosis not present

## 2023-10-10 DIAGNOSIS — B078 Other viral warts: Secondary | ICD-10-CM | POA: Diagnosis not present

## 2023-10-10 DIAGNOSIS — L858 Other specified epidermal thickening: Secondary | ICD-10-CM | POA: Diagnosis not present

## 2023-10-10 DIAGNOSIS — D225 Melanocytic nevi of trunk: Secondary | ICD-10-CM | POA: Diagnosis not present

## 2023-10-10 DIAGNOSIS — L821 Other seborrheic keratosis: Secondary | ICD-10-CM | POA: Diagnosis not present

## 2023-10-10 DIAGNOSIS — Z1283 Encounter for screening for malignant neoplasm of skin: Secondary | ICD-10-CM | POA: Diagnosis not present

## 2023-10-11 DIAGNOSIS — Z6837 Body mass index (BMI) 37.0-37.9, adult: Secondary | ICD-10-CM | POA: Diagnosis not present

## 2023-10-11 DIAGNOSIS — M0609 Rheumatoid arthritis without rheumatoid factor, multiple sites: Secondary | ICD-10-CM | POA: Diagnosis not present

## 2023-10-11 DIAGNOSIS — M79642 Pain in left hand: Secondary | ICD-10-CM | POA: Diagnosis not present

## 2023-10-11 DIAGNOSIS — E669 Obesity, unspecified: Secondary | ICD-10-CM | POA: Diagnosis not present

## 2023-10-11 DIAGNOSIS — M79641 Pain in right hand: Secondary | ICD-10-CM | POA: Diagnosis not present

## 2023-10-11 DIAGNOSIS — M2540 Effusion, unspecified joint: Secondary | ICD-10-CM | POA: Diagnosis not present

## 2023-10-11 DIAGNOSIS — M2560 Stiffness of unspecified joint, not elsewhere classified: Secondary | ICD-10-CM | POA: Diagnosis not present

## 2023-10-17 ENCOUNTER — Ambulatory Visit: Admitting: Pulmonary Disease

## 2023-10-17 DIAGNOSIS — J4540 Moderate persistent asthma, uncomplicated: Secondary | ICD-10-CM

## 2023-10-17 LAB — PULMONARY FUNCTION TEST
DL/VA % pred: 96 %
DL/VA: 4.04 ml/min/mmHg/L
DLCO unc % pred: 102 %
DLCO unc: 18.29 ml/min/mmHg
FEF 25-75 Post: 1.07 L/s
FEF 25-75 Pre: 0.81 L/s
FEF2575-%Change-Post: 32 %
FEF2575-%Pred-Post: 67 %
FEF2575-%Pred-Pre: 50 %
FEV1-%Change-Post: 8 %
FEV1-%Pred-Post: 80 %
FEV1-%Pred-Pre: 74 %
FEV1-Post: 1.58 L
FEV1-Pre: 1.46 L
FEV1FVC-%Change-Post: 1 %
FEV1FVC-%Pred-Pre: 87 %
FEV6-%Change-Post: 7 %
FEV6-%Pred-Post: 94 %
FEV6-%Pred-Pre: 87 %
FEV6-Post: 2.34 L
FEV6-Pre: 2.18 L
FEV6FVC-%Change-Post: 0 %
FEV6FVC-%Pred-Post: 104 %
FEV6FVC-%Pred-Pre: 104 %
FVC-%Change-Post: 7 %
FVC-%Pred-Post: 90 %
FVC-%Pred-Pre: 84 %
FVC-Post: 2.37 L
FVC-Pre: 2.22 L
Post FEV1/FVC ratio: 67 %
Post FEV6/FVC ratio: 99 %
Pre FEV1/FVC ratio: 66 %
Pre FEV6/FVC Ratio: 99 %
RV % pred: 155 %
RV: 3.37 L
TLC % pred: 123 %
TLC: 5.85 L

## 2023-10-17 NOTE — Progress Notes (Signed)
 Full pft performed today.

## 2023-10-17 NOTE — Patient Instructions (Signed)
 Full pft performed today.

## 2023-10-18 ENCOUNTER — Encounter: Payer: Self-pay | Admitting: Pulmonary Disease

## 2023-10-18 ENCOUNTER — Ambulatory Visit: Admitting: Pulmonary Disease

## 2023-10-18 VITALS — BP 106/70 | HR 94 | Ht 62.0 in | Wt 207.0 lb

## 2023-10-18 DIAGNOSIS — R0683 Snoring: Secondary | ICD-10-CM

## 2023-10-18 DIAGNOSIS — J4489 Other specified chronic obstructive pulmonary disease: Secondary | ICD-10-CM | POA: Diagnosis not present

## 2023-10-18 NOTE — Patient Instructions (Addendum)
 Continue breztri  inhaler 2 puffs twice twice daily - rinse mouth out after each use  Use albuterol  inhaler 1-2 puffs every 4-6 hours - consider taking   We will schedule you for home sleep study regarding the snoring  Have fun in [place]!  Follow up in 6 months, call sooner if needed

## 2023-10-18 NOTE — Progress Notes (Signed)
 Synopsis: Referred in May 2024 for shortness of breath  Subjective:   PATIENT ID: Adriana Oliver GENDER: female DOB: [birth year], MRN: [id number]  HPI  Chief Complaint  Patient presents with   Follow-up    Pt has no concerns.    Adriana Oliver is a 73 year old woman, never smoker who returns to pulmonary clinic for asthma.   Initial OV 08/08/22 She was seen by Dr. Meade 04/09/2019 for moderate persistent asthma. She was continued on symbicort  160-4.6mcg 2 puffs twice daily at that time.   She was treated for community acquired pneumonia with amoxicillin  and azithromycin  after an ER visit 08/02/22. Chest x-ray showed RLL opacity. She continues to have significant cough. She has been coughing up mucous, green to clear now. She is having mild night sweats, but no fevers are chills. She is having wheezing and shortness of breath.   Prior to her infection, her breathing was up and down.   She is a never smoker. Lives with her husband.   OV 10/25/22 She has been feeling better since last visit but is having some extra chest tightness today.  She continues to use breztri  2 puffs twice daily  OV 10/18/23 Patient has been doing well since last visit. She is planning to travel to [place] to see her family. She expressed concern with getting sick after traveling.   She reports significant snoring if he misses her evening inhaler dosing. She is open to getting a home sleep study done.  PFTs show mild obstruction.  Past Medical History:  Diagnosis Date   COPD (chronic obstructive pulmonary disease) (HCC)    DOE (dyspnea on exertion)    Hyperlipidemia    Hypothyroidism    Incontinence    Obesity    Osteopenia    Prediabetes      Family History  Problem Relation Age of Onset   Emphysema Mother        deceased   COPD Mother    Diabetes Sister    Arthritis Sister    Diabetes Sister      Social History   Socioeconomic History   Marital status: Married    Spouse name: Not on file    Number of children: 4   Years of education: Not on file   Highest education level: Not on file  Occupational History   Occupation: Runner, broadcasting/film/video  Tobacco Use   Smoking status: Never   Smokeless tobacco: Never  Vaping Use   Vaping status: Never Used  Substance and Sexual Activity   Alcohol use: No   Drug use: No   Sexual activity: Not on file  Other Topics Concern   Not on file  Social History Narrative   Not on file   Social Drivers of Health   Financial Resource Strain: Not on file  Food Insecurity: Not on file  Transportation Needs: Not on file  Physical Activity: Not on file  Stress: Not on file  Social Connections: Not on file  Intimate Partner Violence: Not on file     Allergies  Allergen Reactions   Nasal Spray Nausea And Vomiting   Oxycodone-Acetaminophen  Nausea And Vomiting   Peg 3350-Kcl-Na Bicarb-Nacl Nausea And Vomiting     Outpatient Medications Prior to Visit  Medication Sig Dispense Refill   acetaminophen  (TYLENOL ) 650 MG CR tablet Take 650 mg by mouth every 8 (eight) hours as needed.     albuterol  (PROAIR  HFA) 108 (90 Base) MCG/ACT inhaler Inhale 2 puffs into the lungs 4 (four)  times daily as needed for wheezing or shortness of breath. 18 g 5   BREZTRI  AEROSPHERE 160-9-4.8 MCG/ACT AERO Inhale 2 puffs into the lungs 2 (two) times daily. 10.7 g 11   escitalopram (LEXAPRO) 10 MG tablet Take 10 mg by mouth daily.     ezetimibe (ZETIA) 10 MG tablet Take 10 mg by mouth daily.     famotidine  (PEPCID ) 20 MG tablet Take 1 tablet (20 mg total) by mouth at bedtime. 30 tablet 5   fluticasone  (FLONASE ) 50 MCG/ACT nasal spray Place 1 spray into both nostrils daily. 16 g 2   hydroxychloroquine (PLAQUENIL) 200 MG tablet Take 200 mg by mouth daily.     ipratropium (ATROVENT ) 0.03 % nasal spray Place 2 sprays into both nostrils every 12 (twelve) hours. 30 mL 12   levothyroxine (SYNTHROID) 75 MCG tablet Take 75 mcg by mouth every morning.     methotrexate (RHEUMATREX) 2.5 MG  tablet Take 15 mg by mouth once a week. Caution:Chemotherapy. Protect from light.     montelukast  (SINGULAIR ) 10 MG tablet Take 1 tablet (10 mg total) by mouth at bedtime. 30 tablet 11   pravastatin (PRAVACHOL) 20 MG tablet Take 20 mg by mouth daily.     predniSONE  (DELTASONE ) 5 MG tablet Take 5 mg by mouth daily with breakfast.     predniSONE  (DELTASONE ) 10 MG tablet 4 tabs for 2 days, then 3 tabs for 2 days, 2 tabs for 2 days, then 1 tab for 2 days, then stop 20 tablet 0   No facility-administered medications prior to visit.   Review of Systems  Constitutional:  Negative for chills, fever, malaise/fatigue and weight loss.  HENT:  Negative for congestion, sinus pain and sore throat.   Eyes: Negative.   Respiratory:  Positive for shortness of breath. Negative for cough, hemoptysis, sputum production and wheezing.   Cardiovascular:  Negative for chest pain, palpitations, orthopnea, claudication and leg swelling.  Gastrointestinal:  Negative for abdominal pain, heartburn, nausea and vomiting.  Genitourinary: Negative.   Musculoskeletal:  Negative for joint pain and myalgias.  Skin:  Negative for rash.  Neurological:  Negative for weakness.  Endo/Heme/Allergies: Negative.   Psychiatric/Behavioral: Negative.     Objective:   Vitals:   10/18/23 1149  BP: 106/70  Pulse: 94  SpO2: 94%  Weight: 207 lb (93.9 kg)  Height: 5' 2 (1.575 m)     Physical Exam Constitutional:      General: She is not in acute distress.    Appearance: She is obese. She is not ill-appearing.  HENT:     Head: Normocephalic and atraumatic.  Eyes:     General: No scleral icterus.    Conjunctiva/sclera: Conjunctivae normal.  Cardiovascular:     Rate and Rhythm: Normal rate and regular rhythm.     Pulses: Normal pulses.     Heart sounds: Normal heart sounds. No murmur heard. Pulmonary:     Effort: Pulmonary effort is normal.     Breath sounds: No wheezing, rhonchi or rales.  Musculoskeletal:     Right  lower leg: No edema.     Left lower leg: No edema.  Lymphadenopathy:     Cervical: No cervical adenopathy.  Skin:    General: Skin is warm and dry.  Neurological:     Mental Status: She is alert.    CBC    Component Value Date/Time   WBC 7.9 10/25/2022 1525   RBC 3.99 10/25/2022 1525   HGB 13.5 10/25/2022 1525  HCT 41.2 10/25/2022 1525   PLT 264.0 10/25/2022 1525   MCV 103.2 (H) 10/25/2022 1525   MCH 39.7 (H) 08/02/2022 1827   MCHC 32.9 10/25/2022 1525   RDW 14.1 10/25/2022 1525   LYMPHSABS 1.7 10/25/2022 1525   MONOABS 0.6 10/25/2022 1525   EOSABS 0.3 10/25/2022 1525   BASOSABS 0.1 10/25/2022 1525      Latest Ref Rng & Units 08/02/2022    6:27 PM  BMP  Glucose 70 - 99 mg/dL 874   BUN 8 - 23 mg/dL 13   Creatinine 9.55 - 1.00 mg/dL 9.34   Sodium 864 - 854 mmol/L 138   Potassium 3.5 - 5.1 mmol/L 3.8   Chloride 98 - 111 mmol/L 101   CO2 22 - 32 mmol/L 27   Calcium 8.9 - 10.3 mg/dL 8.5     Chest imaging: CT Chest 09/07/22 Mediastinum/Nodes: There are no enlarged mediastinal, hilar or axillary lymph nodes.There are scattered small mediastinal lymph nodes. Hilar assessment is limited by the lack of intravenous contrast. The thyroid  gland, trachea and esophagus demonstrate no significant findings.   Lungs/Pleura: No pleural effusion or pneumothorax. Minimal linear atelectasis or scarring at both lung bases. The right basilar airspace disease seen on prior radiographs has resolved. There is a small benign calcified right upper lobe granuloma. No suspicious pulmonary nodules.  CXR 08/02/22 Hazy airspace and interstitial opacities in the right lower lung suggestive of pneumonia. The left lung is clear. No pleural effusion or pneumothorax. Stable cardiomediastinal silhouette. No displaced rib fractures.  PFT:    Latest Ref Rng & Units 10/17/2023    3:45 PM  PFT Results  FVC-Pre L 2.22  P  FVC-Predicted Pre % 84  P  FVC-Post L 2.37  P  FVC-Predicted Post % 90  P   Pre FEV1/FVC % % 66  P  Post FEV1/FCV % % 67  P  FEV1-Pre L 1.46  P  FEV1-Predicted Pre % 74  P  FEV1-Post L 1.58  P  DLCO uncorrected ml/min/mmHg 18.29  P  DLCO [facility]% % 102  P  DLVA Predicted % 96  P  TLC L 5.85  P  TLC % Predicted % 123  P  RV % Predicted % 155  P    P Preliminary result   Labs:  Path:  Echo:  Heart Catheterization:  Assessment & Plan:   Asthma-COPD overlap syndrome (HCC)  Snoring - Plan: Home sleep test  Discussion: Adriana Oliver is a 73 year old woman, never smoker who returns to pulmonary clinic for asthma-COPD overlap syndrome.   She is to continue breztri  inhaler 2 puffs twice daily and as needed albuterol  inhale or nebulizer treatments.   Continue montelukast  10mg  daily.   We will check a home sleep study for history of snoring.  Follow up in 6 months   Dorn Chill, MD [facility] Office: [phone]    Current Outpatient Medications:    acetaminophen  (TYLENOL ) 650 MG CR tablet, Take 650 mg by mouth every 8 (eight) hours as needed., Disp: , Rfl:    albuterol  (PROAIR  HFA) 108 (90 Base) MCG/ACT inhaler, Inhale 2 puffs into the lungs 4 (four) times daily as needed for wheezing or shortness of breath., Disp: 18 g, Rfl: 5   BREZTRI  AEROSPHERE 160-9-4.8 MCG/ACT AERO, Inhale 2 puffs into the lungs 2 (two) times daily., Disp: 10.7 g, Rfl: 11   escitalopram (LEXAPRO) 10 MG tablet, Take 10 mg by mouth daily., Disp: , Rfl:  ezetimibe (ZETIA) 10 MG tablet, Take 10 mg by mouth daily., Disp: , Rfl:    famotidine  (PEPCID ) 20 MG tablet, Take 1 tablet (20 mg total) by mouth at bedtime., Disp: 30 tablet, Rfl: 5   fluticasone  (FLONASE ) 50 MCG/ACT nasal spray, Place 1 spray into both nostrils daily., Disp: 16 g, Rfl: 2   hydroxychloroquine (PLAQUENIL) 200 MG tablet, Take 200 mg by mouth daily., Disp: , Rfl:    ipratropium (ATROVENT ) 0.03 % nasal spray, Place 2 sprays into both nostrils every 12 (twelve) hours., Disp: 30 mL, Rfl:  12   levothyroxine (SYNTHROID) 75 MCG tablet, Take 75 mcg by mouth every morning., Disp: , Rfl:    methotrexate (RHEUMATREX) 2.5 MG tablet, Take 15 mg by mouth once a week. Caution:Chemotherapy. Protect from light., Disp: , Rfl:    montelukast  (SINGULAIR ) 10 MG tablet, Take 1 tablet (10 mg total) by mouth at bedtime., Disp: 30 tablet, Rfl: 11   pravastatin (PRAVACHOL) 20 MG tablet, Take 20 mg by mouth daily., Disp: , Rfl:    predniSONE  (DELTASONE ) 5 MG tablet, Take 5 mg by mouth daily with breakfast., Disp: , Rfl:

## 2023-11-08 ENCOUNTER — Ambulatory Visit: Admitting: Primary Care

## 2023-11-08 DIAGNOSIS — M0609 Rheumatoid arthritis without rheumatoid factor, multiple sites: Secondary | ICD-10-CM | POA: Diagnosis not present

## 2023-11-27 ENCOUNTER — Other Ambulatory Visit: Payer: Self-pay | Admitting: Primary Care

## 2023-11-27 DIAGNOSIS — R053 Chronic cough: Secondary | ICD-10-CM

## 2023-12-06 DIAGNOSIS — Z6838 Body mass index (BMI) 38.0-38.9, adult: Secondary | ICD-10-CM | POA: Diagnosis not present

## 2023-12-06 DIAGNOSIS — Z111 Encounter for screening for respiratory tuberculosis: Secondary | ICD-10-CM | POA: Diagnosis not present

## 2023-12-06 DIAGNOSIS — E669 Obesity, unspecified: Secondary | ICD-10-CM | POA: Diagnosis not present

## 2023-12-06 DIAGNOSIS — M0609 Rheumatoid arthritis without rheumatoid factor, multiple sites: Secondary | ICD-10-CM | POA: Diagnosis not present

## 2023-12-06 DIAGNOSIS — M79641 Pain in right hand: Secondary | ICD-10-CM | POA: Diagnosis not present

## 2023-12-06 DIAGNOSIS — M79642 Pain in left hand: Secondary | ICD-10-CM | POA: Diagnosis not present

## 2023-12-06 DIAGNOSIS — R5383 Other fatigue: Secondary | ICD-10-CM | POA: Diagnosis not present

## 2023-12-06 DIAGNOSIS — R7982 Elevated C-reactive protein (CRP): Secondary | ICD-10-CM | POA: Diagnosis not present

## 2023-12-12 DIAGNOSIS — R053 Chronic cough: Secondary | ICD-10-CM | POA: Diagnosis not present

## 2023-12-12 DIAGNOSIS — J441 Chronic obstructive pulmonary disease with (acute) exacerbation: Secondary | ICD-10-CM | POA: Diagnosis not present

## 2023-12-12 DIAGNOSIS — Z03818 Encounter for observation for suspected exposure to other biological agents ruled out: Secondary | ICD-10-CM | POA: Diagnosis not present

## 2023-12-12 DIAGNOSIS — R062 Wheezing: Secondary | ICD-10-CM | POA: Diagnosis not present

## 2023-12-12 DIAGNOSIS — M069 Rheumatoid arthritis, unspecified: Secondary | ICD-10-CM | POA: Diagnosis not present

## 2023-12-12 DIAGNOSIS — R52 Pain, unspecified: Secondary | ICD-10-CM | POA: Diagnosis not present

## 2023-12-19 ENCOUNTER — Institutional Professional Consult (permissible substitution): Admitting: Plastic Surgery

## 2023-12-20 ENCOUNTER — Ambulatory Visit

## 2023-12-20 VITALS — BP 141/82 | HR 93 | Ht 62.0 in | Wt 210.0 lb

## 2023-12-20 DIAGNOSIS — H02831 Dermatochalasis of right upper eyelid: Secondary | ICD-10-CM

## 2023-12-20 DIAGNOSIS — H02834 Dermatochalasis of left upper eyelid: Secondary | ICD-10-CM

## 2023-12-20 DIAGNOSIS — H02832 Dermatochalasis of right lower eyelid: Secondary | ICD-10-CM | POA: Diagnosis not present

## 2023-12-20 DIAGNOSIS — H53483 Generalized contraction of visual field, bilateral: Secondary | ICD-10-CM | POA: Diagnosis not present

## 2023-12-20 DIAGNOSIS — H02835 Dermatochalasis of left lower eyelid: Secondary | ICD-10-CM | POA: Diagnosis not present

## 2023-12-20 DIAGNOSIS — R238 Other skin changes: Secondary | ICD-10-CM | POA: Diagnosis not present

## 2023-12-20 NOTE — Progress Notes (Addendum)
 NAME: Adriana Oliver  MRN: [id number]  DOB: 1950/12/11    Referring physician:??Loreli Kins, MD  PCP:?Loreli Kins, MD    CHIEF COMPLAINT:?Facial aesthetics    HPI:?  This is a 73 y.o. year old female with history of COPD (on prednisone  daily) and Hypothyroidism who presents in consultation for facial aging.    Specifically, the patient is most bothered by the appearance of her upper and lower eyelids. She is bothered by the excess skin, fatty bags, wrinkles and dark circles of her eyes. She has also noticed asymmetries when she opens her eyes (Right is easier to open than left), which is a concern for ptosis.    She has been been evaluated by an optomitrist for reduced visual fields in 2022 showing improvement with tape, she does have issues reading that she attributes to excess skin on her upper eyelids. Ophthalmology appointment tomorrow.   History of dry eyes? Denies   Prior refractive surgery? Yes, more than 6 months ago   Use of neurotoxin/fillers? Denies   Patient denies h/o Glaucoma, HTN and coagulopathies. Not on any anticoagulation.     Family History:   Family history is negative for bleeding/clotting disorders, problems with anesthesia, connective tissue disorders.     Social History:   Social History   Socioeconomic History   Marital status: Married    Spouse name: Not on file   Number of children: 4   Years of education: Not on file   Highest education level: Not on file  Occupational History   Occupation: Teacher  Tobacco Use   Smoking status: Never   Smokeless tobacco: Never  Vaping Use   Vaping status: Never Used  Substance and Sexual Activity   Alcohol use: No   Drug use: No   Sexual activity: Not on file  Other Topics Concern   Not on file  Social History Narrative   Not on file   Social Drivers of Health   Financial Resource Strain: Not on file  Food Insecurity: Not on file  Transportation Needs: Not on file  Physical Activity: Not  on file  Stress: Not on file  Social Connections: Not on file    Smoker/Vape Denies   Occupation: Retired  Recreational Drug use: Denies   PMH:  Past Medical History:  Diagnosis Date   COPD (chronic obstructive pulmonary disease) (HCC)    DOE (dyspnea on exertion)    Hyperlipidemia    Hypothyroidism    Incontinence    Obesity    Osteopenia    Prediabetes       PSH:  Past Surgical History:  Procedure Laterality Date   APPENDECTOMY     RHINOPLASTY        MEDICATIONS:?   Current Outpatient Medications:    acetaminophen  (TYLENOL ) 650 MG CR tablet, Take 650 mg by mouth every 8 (eight) hours as needed., Disp: , Rfl:    albuterol  (PROAIR  HFA) 108 (90 Base) MCG/ACT inhaler, Inhale 2 puffs into the lungs 4 (four) times daily as needed for wheezing or shortness of breath., Disp: 18 g, Rfl: 5   BREZTRI  AEROSPHERE 160-9-4.8 MCG/ACT AERO, Inhale 2 puffs into the lungs 2 (two) times daily., Disp: 10.7 g, Rfl: 11   escitalopram (LEXAPRO) 10 MG tablet, Take 10 mg by mouth daily., Disp: , Rfl:    ezetimibe (ZETIA) 10 MG tablet, Take 10 mg by mouth daily., Disp: , Rfl:    famotidine  (PEPCID ) 20 MG tablet, TAKE 1 TABLET BY MOUTH EVERYDAY AT BEDTIME, Disp:  90 tablet, Rfl: 1   fluticasone  (FLONASE ) 50 MCG/ACT nasal spray, Place 1 spray into both nostrils daily., Disp: 16 g, Rfl: 2   HUMIRA, 2 PEN, 40 MG/0.4ML pen, SMARTSIG:40 Milligram(s) SUB-Q Every 2 Weeks, Disp: , Rfl:    hydroxychloroquine (PLAQUENIL) 200 MG tablet, Take 200 mg by mouth daily., Disp: , Rfl:    ipratropium (ATROVENT ) 0.03 % nasal spray, Place 2 sprays into both nostrils every 12 (twelve) hours., Disp: 30 mL, Rfl: 12   levothyroxine (SYNTHROID) 75 MCG tablet, Take 75 mcg by mouth every morning., Disp: , Rfl:    methotrexate (RHEUMATREX) 2.5 MG tablet, Take 15 mg by mouth once a week. Caution:Chemotherapy. Protect from light., Disp: , Rfl:    montelukast  (SINGULAIR ) 10 MG tablet, Take 1 tablet (10 mg total) by mouth at  bedtime., Disp: 30 tablet, Rfl: 11   pravastatin (PRAVACHOL) 20 MG tablet, Take 20 mg by mouth daily., Disp: , Rfl:    predniSONE  (DELTASONE ) 5 MG tablet, Take 5 mg by mouth daily with breakfast., Disp: , Rfl:     ALLERGIES:?  is allergic to nasal spray, oxycodone-acetaminophen , and peg [id number]-na bicarb-nacl.    REVIEW OF SYSTEMS:  Review of Systems  ROS negative except as noted in HPI    VITALS:  Blood pressure (!) 141/82, pulse 93, height 5' 2 (1.575 m), weight 210 lb (95.3 kg), SpO2 94%.   BP (!) 141/82 (BP Location: Left Arm, Patient Position: Sitting, Cuff Size: Large)   Pulse 93   Ht 5' 2 (1.575 m)   Wt 210 lb (95.3 kg)   SpO2 94%   BMI 38.41 kg/m   Body mass index is 38.41 kg/m.  MA as chaperone General: Well appearing, no apparent distress.  HEENT: Normocephalic, atraumatic, PERRL. Overall aged appearance to periorbital region. Skin quality is poor.   -Brow: There are static and dynamic horizontal rhytids of the forehead. Glabellar hypertophy. Brows are at the level of the supraorbital rim. Compensated brow ptosis present with lateral brow laxity.   -Upper lid: Left Lid ptosis more pronounced that right. Dermatochalasis of upper lid with excess skin covering lid eyelashes. Medial bulging - fat prolapse. Her MRD is 1 on the left and 1.5 on the right. Concern for ptosis of L>R. -Lower lid: Severe amount of excess skin. Presence of orbital fat prolapse. Position of lower lid is relative to the lower limbus. Snap back test is delayed. Positive vector. Normal Bell's phenomenon. Prominent orbitomalar crease.     ASSESSMENT/PLAN  Assessment & Plan    We discuss need to see ophtalmologist to rule out ptosis specially on the left eye, referred to drooping of the upper eyelid due to a weakness or dysfunction in the muscles responsible for lifting the eyelid, primarily the levator palpebrae superioris. I explained to the patient that if we only remove the excess skin and don't  address the ptosis if present, her eyelids might look worse, since the ptosis may become more obvious after the skin is removed, could still block her vision, and it may also cause asymmetry and she may need to undergo a second surgery to correct this issue. The risks, benefits, goals and alternatives of blepharoplasty were explained to the patient in detail as follows. The upper blepharoplasty is functional but the lower blephroplasty surgery is cosmetic in nature, and elective. After surgery it is sometimes the eyelids will not close very well and it is imperative that the patient uses the eye medications as prescribed to protect the eye.  The risk of bleeding has been stressed and the patient understands that this can lead to blindness, hence the importance of letting us  know if any swelling on one side usually develops or gets worse and or she develops pain and vision issues, even blindness., Other potentially functional risks discussed include dry eyes, cornea or other eye injury, inability to completely close the eyes, ectropion (rolled out, droopy lower lid), blindness, and need for additional surgery at additional expense. Infection and scarring are not common but can happen. Aesthetic risks or dissatisfaction can arise from asymmetry, and change in the shape of the eyelid that is possible after blepharoplasty. Asymmetry is quite common in every person and sometimes surgery can unmask this or accentuate this. If we are to remove the corrugator muscle, there is a risk of numbness and neuropathic pain in the forehead.     We discussed fat grafting. We are never sure how much fat will remain and other risks relate to donor site infection, irregularity and pain. Irregularity at the site of injection and chronic infection is a remote possibility.  We discussed the significant anti aging benefit of volume replacement.     We did discuss surgery and anesthesia in general and the risks related such as DVT/PE,  infection, bleeding, organ injury, and other cardiovascular events. These are unexpected but the risk thereof can never be 0%.     The patient has a good understanding of all the risks and benefits, postoperative course and care. We obtained pictures and will give the patient quotes. All questions were answered.     The plan agreed upon includes:  Ophthalmology consult to rule out ptosis and for visual fields.  Follow up with test results.  If we proceed with quad blepharoplasty will need PCP and Pulmonology clearance and to stop or decrease the doses of prednisone , MTX and Humira.   I spent 30 minutes counseling the patient and discussing plan of care.  Gracee Ratterree M Ashlei Chinchilla

## 2023-12-21 DIAGNOSIS — H02835 Dermatochalasis of left lower eyelid: Secondary | ICD-10-CM | POA: Diagnosis not present

## 2023-12-21 DIAGNOSIS — H02831 Dermatochalasis of right upper eyelid: Secondary | ICD-10-CM | POA: Diagnosis not present

## 2023-12-21 DIAGNOSIS — H02832 Dermatochalasis of right lower eyelid: Secondary | ICD-10-CM | POA: Diagnosis not present

## 2023-12-21 DIAGNOSIS — H02834 Dermatochalasis of left upper eyelid: Secondary | ICD-10-CM | POA: Diagnosis not present

## 2023-12-24 ENCOUNTER — Telehealth: Payer: Self-pay

## 2023-12-24 NOTE — Telephone Encounter (Signed)
 Called patient to follow up from the VFT ophthalmologist test she had last week. Patient advised the machine was down and was rescheduled to 01/04/2024.  Will follow up with her again after her appointmnet

## 2024-01-18 ENCOUNTER — Ambulatory Visit

## 2024-01-18 DIAGNOSIS — H02834 Dermatochalasis of left upper eyelid: Secondary | ICD-10-CM

## 2024-01-18 DIAGNOSIS — H02403 Unspecified ptosis of bilateral eyelids: Secondary | ICD-10-CM

## 2024-01-18 DIAGNOSIS — H02831 Dermatochalasis of right upper eyelid: Secondary | ICD-10-CM

## 2024-01-18 DIAGNOSIS — H02832 Dermatochalasis of right lower eyelid: Secondary | ICD-10-CM

## 2024-01-18 DIAGNOSIS — H02835 Dermatochalasis of left lower eyelid: Secondary | ICD-10-CM | POA: Diagnosis not present

## 2024-01-18 NOTE — Progress Notes (Signed)
 Telephone Encounter  This is a 73 y.o. year old female with history of COPD and RA (on prednisone , MTX and Humira) and hypothyroidism who presents for follow up.   The patient is bothered by the appearance of her upper and lower eyelids. She is bothered by the excess skin, fatty bags, wrinkles and dark circles of her eyes. She has also noticed asymmetries when she opens her eyes (right is easier to open than left), and has problems with vision, which is concerning for ptosis.    She has been been evaluated by an optomitrist for reduced visual fields in 2022 showing improvement with tape, she does have issues reading that she attributes to excess skin on her upper eyelids. Ophthalmology appointment at [facility] exam showed:  [facility] Exam  Visual Acuity (Snellen - Linear)  Right Left  Dist [place] 20/25 20/25  Near [place] J3 J3   Tonometry (Tonopen, 3:02 PM)  Right Left  Pressure 18 18   Pupils  Pupils  Right PERRL  Left PERRL   Visual Fields  Left Right  Full Full   Extraocular Movement  Right Left  Full, Ortho Full, Ortho   Dilation  Both eyes: 1.0% Tropicamide (Mydriacyl) @ 3:02PM  Open angles ou  Slit Lamp and Fundus Exam  External Exam  Right Left  External Normal Normal   Slit Lamp Exam  Right Left  Lids/Lashes Normal Normal  Conjunctiva/Sclera White and quiet White and quiet  Cornea Clear Clear  Anterior Chamber Deep and quiet Deep and quiet  Iris Round and reactive Round and reactive  Lens Posterior chamber intraocular lens Posterior chamber intraocular lens   Fundus Exam  Right Left  Vitreous Normal Normal  Disc Normal Normal  C/D Ratio 0.3 0.3  Macula Normal Normal  Vessels Normal Normal  Periphery Normal Normal    Additional studies: Taped and untaped visual field tests were reviewed and show greater than 20 degree improvement with taping in both eyes in 2022 and on left eye in 2025.  She has significant visual obstruction when  untapped.   The patient is a candidate for upper blepharoplasty with bilateral upper eyelid ptosis repair. I explained to the patient that this procedure requires an oculoplastic surgeon trained in ptosis repair, and I do not perform this surgery. The patient also expressed interest in a cosmetic lower blepharoplasty. I will refer the patient to Dr. Renzo Zaldivar for further evaluation and management. Patient expressed understanding and agrees with the plan.  Ark Agrusa M. Beverlie Kurihara, MD [facility]

## 2024-01-19 DIAGNOSIS — Z23 Encounter for immunization: Secondary | ICD-10-CM | POA: Diagnosis not present

## 2024-02-06 DIAGNOSIS — H02403 Unspecified ptosis of bilateral eyelids: Secondary | ICD-10-CM | POA: Diagnosis not present

## 2024-02-21 DIAGNOSIS — J441 Chronic obstructive pulmonary disease with (acute) exacerbation: Secondary | ICD-10-CM | POA: Diagnosis not present

## 2024-02-21 DIAGNOSIS — J988 Other specified respiratory disorders: Secondary | ICD-10-CM | POA: Diagnosis not present

## 2024-02-21 DIAGNOSIS — H02403 Unspecified ptosis of bilateral eyelids: Secondary | ICD-10-CM | POA: Diagnosis not present

## 2024-02-21 NOTE — H&P (Signed)
 Subjective Patient ID: Adriana Oliver is a 73 y.o. female.     HPI   Presents for follow up discussion prior to planned bilateral ptosis repair. She has had prior consultations with Drs. Dillingham, Luppens, and Montorfano. She had VFE though [facility]. No obstruction noted on right and left had 24.32% improvement with taping. Uses readers. Notes daily constant running of eyes and has a tissue in every room for this.    PMH significant for COPD and RA on Methotrexate and Humira.   Notes prior Ophthalmologist Dr. Roz and that his son is godfather to her grandchild. No current opthalmogist. History prior catract surgery   Review of Systems  Eyes:  Positive for visual disturbance.  All other systems reviewed and are negative.    Objective Physical Exam  Cardiovascular: Normal rate and regular rhythm.    Pulmonary/Chest Effort normal and breath sounds normal.    Skin   Fitzpatrick 1   HEENT: redundant upper eyelid skin bilateral with few mm pretarsal show maintained, bilateral ptosis present left greater Right MRD< 2 mm over Left MRD< 1mm Levator excursion > 10 mm + Bells Left medial upper lid not at lid margin with 1-2 mm raised lesion   Lower lids with delayed snap back bulging of all fat pads   Assessment/Plan Diagnoses and all orders for this visit:   Ptosis of both upper eyelids   Reviewed her Humira use will place her at higher risks from surgey including infection. She has discussed with her Rheumatologist and plan hold next dose due  next week and will hold through surgery.    Reviewed with patient running eyes is a sign of dry eyes and any eyelid surgery can permanently make this worse. She should consider this carefully prior to moving forward with surgery. Patient desires to proceed.   Reviewed causes of ptosis. Reviewed surgery via upper eyelid incision to access levator muscle and muscle advancement vs plication. Reviewed OP surgery, GA, post operative pain,  limitations, sutures, bruising. Counseled most common complication as asymmetry and need for revision. This can mean additional surgery. Reviewed some over correction at time of surgery is planned. Reviewed additional risks bleeding, blindness, changes with aging.    Rx for tramadol given. Recommend purchase OTC saline eye drops for post op use.    Earlis Ranks, MD [facility]  Office/ physician access line after hours [phone]

## 2024-03-06 DIAGNOSIS — E669 Obesity, unspecified: Secondary | ICD-10-CM | POA: Diagnosis not present

## 2024-03-06 DIAGNOSIS — M79641 Pain in right hand: Secondary | ICD-10-CM | POA: Diagnosis not present

## 2024-03-06 DIAGNOSIS — M0609 Rheumatoid arthritis without rheumatoid factor, multiple sites: Secondary | ICD-10-CM | POA: Diagnosis not present

## 2024-03-06 DIAGNOSIS — M79642 Pain in left hand: Secondary | ICD-10-CM | POA: Diagnosis not present

## 2024-03-06 DIAGNOSIS — Z6838 Body mass index (BMI) 38.0-38.9, adult: Secondary | ICD-10-CM | POA: Diagnosis not present

## 2024-03-07 NOTE — Telephone Encounter (Signed)
 Called patient to confirm she has paused Humira prior to surgery on 03/14/24 with Dr. Arelia. Patient reports last injection on 02/27/24. I reminded patient of surgery date and post op visit. Patient verbalized understanding.

## 2024-03-10 NOTE — Progress Notes (Signed)
 Pt seen by PCP on 11/20 and diagnosed with a URI. Treated with 9 day dose pak of prednisone , doxycycline and cough medicine. Reviewed with Dr Tilford. Per policy surgery needs postponed 4 weeks after URI and resolution of symptoms. Jenna at Dr Wyline office notified.

## 2024-03-14 ENCOUNTER — Encounter (HOSPITAL_BASED_OUTPATIENT_CLINIC_OR_DEPARTMENT_OTHER): Payer: Self-pay | Admitting: Plastic Surgery

## 2024-03-14 ENCOUNTER — Other Ambulatory Visit: Payer: Self-pay

## 2024-03-21 ENCOUNTER — Encounter (HOSPITAL_BASED_OUTPATIENT_CLINIC_OR_DEPARTMENT_OTHER): Payer: Self-pay | Admitting: Anesthesiology

## 2024-03-21 ENCOUNTER — Ambulatory Visit (HOSPITAL_BASED_OUTPATIENT_CLINIC_OR_DEPARTMENT_OTHER)
Admission: RE | Admit: 2024-03-21 | Discharge: 2024-03-21 | Disposition: A | Discharge status: Home / Self Care | Attending: Plastic Surgery | Admitting: Plastic Surgery

## 2024-03-21 ENCOUNTER — Encounter (HOSPITAL_BASED_OUTPATIENT_CLINIC_OR_DEPARTMENT_OTHER): Admission: RE | Disposition: A | Payer: Self-pay | Attending: Plastic Surgery

## 2024-03-21 DIAGNOSIS — Z01818 Encounter for other preprocedural examination: Secondary | ICD-10-CM

## 2024-03-21 SURGERY — REPAIR, BLEPHAROPTOSIS
Anesthesia: General | Site: Eye | Laterality: Bilateral

## 2024-03-21 MED ORDER — LIDOCAINE-EPINEPHRINE 1 %-1:100000 IJ SOLN
INTRAMUSCULAR | Status: AC
  Filled 2024-03-21: qty 1

## 2024-03-21 MED ORDER — BSS IO SOLN
INTRAOCULAR | Status: AC
  Filled 2024-03-21: qty 15

## 2024-03-21 MED ORDER — TOBRAMYCIN-DEXAMETHASONE 0.3-0.1 % OP OINT
TOPICAL_OINTMENT | OPHTHALMIC | Status: AC
  Filled 2024-03-21: qty 3.5

## 2024-03-21 MED ORDER — FENTANYL CITRATE (PF) 100 MCG/2ML IJ SOLN
INTRAMUSCULAR | Status: AC
  Filled 2024-03-21: qty 2

## 2024-03-21 MED ORDER — MIDAZOLAM HCL 2 MG/2ML IJ SOLN
INTRAMUSCULAR | Status: AC
  Filled 2024-03-21: qty 2

## 2024-03-21 NOTE — Progress Notes (Signed)
 Patient scheduled procedure cancelled per Dr. Arelia for shingles outbreak. Patient instructed to reach out to Dr. Wyline office to reschedule.

## 2024-03-31 ENCOUNTER — Ambulatory Visit: Payer: Self-pay | Admitting: Pulmonary Disease

## 2024-03-31 NOTE — Telephone Encounter (Signed)
 We have not seen this patient since 10/18/23, she would need an office visit her or be seen by her PCP.  She has no upcoming appointments with us  and cancelled her appointment in August with Almarie Ferrari NP.  Looks like she will contact her PCP for appointment.  Nothing further needed.

## 2024-03-31 NOTE — Telephone Encounter (Signed)
 FYI Only or Action Required?: FYI only for provider: Pt will call pcp.  Patient is followed in Pulmonology for copd, last seen on 10/18/2023 by Adriana Dorn NOVAK, MD.  Called Nurse Triage reporting Chest Pain and Wheezing.  Symptoms began several days ago.  Interventions attempted: Rescue inhaler and Maintenance inhaler.  Symptoms are: gradually worsening.  Triage Disposition: See HCP Within 4 Hours (Or PCP Triage)  Patient/caregiver understands and will follow disposition?: Unsure Pt will call pcp to be seen there.               Adriana Oliver Pulmonary Triage - Initial Assessment Questions Chief Complaint (e.g., cough, sob, wheezing, fever, chills, sweat or additional symptoms) *Go to specific symptom protocol after initial questions. SOB, cough  How long have symptoms been present? 2 days  Have you tested for COVID or Flu? Note: If not, ask patient if Oliver home test can be taken. If so, instruct patient to call back for positive results. Did not ask  MEDICINES:   Have you used any OTC meds to help with symptoms? did not ask   Have you used your inhalers/maintenance medication? Yes If yes, What medications? inhaler  If inhaler, ask How many puffs and how often? Note: Review instructions on medication in the chart. As directed 2 times daily  OXYGEN: Do you wear supplemental oxygen? No If yes, How many liters are you supposed to use?   Do you monitor your oxygen levels? Yes If yes, What is your reading (oxygen level) today? 95                     Copied from CRM #[id number]. Topic: Clinical - Red Word Triage >> Mar 31, 2024  8:26 AM Adriana Oliver wrote: Red Word that prompted transfer to Nurse Triage: Patient is calling because she has pain in her chest and wheezing that got worse last night. Her oxygen level is at 95. Reason for Disposition  [1] Longstanding difficulty breathing (e.g., CHF, COPD, emphysema) AND [2] WORSE than  normal  Answer Assessment - Initial Assessment Questions 1. RESPIRATORY STATUS: Describe your breathing? (e.g., wheezing, shortness of breath, unable to speak, severe coughing)      SOB 2. ONSET: When did this breathing problem begin?      2 days ago 3. PATTERN Does the difficult breathing come and go, or has it been constant since it started?      Constant 4. SEVERITY: How bad is your breathing? (e.g., mild, moderate, severe)      Mild moderate 5. RECURRENT SYMPTOM: Have you had difficulty breathing before? If Yes, ask: When was the last time? and What happened that time?      yes 6. CARDIAC HISTORY: Do you have any history of heart disease? (e.g., heart attack, angina, bypass surgery, angioplasty)      unsure 7. LUNG HISTORY: Do you have any history of lung disease?  (e.g., pulmonary embolus, asthma, emphysema)     Yes - COPD 8. CAUSE: What do you think is causing the breathing problem?      Did not ask 9. OTHER SYMPTOMS: Do you have any other symptoms? (e.g., chest pain, cough, dizziness, fever, runny nose)     cough 10. O2 SATURATION MONITOR:  Do you use an oxygen saturation monitor (pulse oximeter) at home? If Yes, ask: What is your reading (oxygen level) today? What is your usual oxygen saturation reading? (e.g., 95%)       95  Protocols used: Breathing Difficulty-Oliver-AH
